# Patient Record
Sex: Female | Born: 1985 | Race: Black or African American | Hispanic: No | Marital: Single | Smoking: Current every day smoker
Health system: Southern US, Community
[De-identification: ages and names within clinical notes are randomized; demographics above are authoritative.]

## PROBLEM LIST (undated history)

## (undated) ENCOUNTER — Inpatient Hospital Stay (HOSPITAL_COMMUNITY): Payer: Self-pay

## (undated) DIAGNOSIS — O139 Gestational [pregnancy-induced] hypertension without significant proteinuria, unspecified trimester: Secondary | ICD-10-CM

## (undated) DIAGNOSIS — N390 Urinary tract infection, site not specified: Secondary | ICD-10-CM

## (undated) DIAGNOSIS — A599 Trichomoniasis, unspecified: Secondary | ICD-10-CM

## (undated) DIAGNOSIS — IMO0002 Reserved for concepts with insufficient information to code with codable children: Secondary | ICD-10-CM

## (undated) DIAGNOSIS — C539 Malignant neoplasm of cervix uteri, unspecified: Secondary | ICD-10-CM

## (undated) DIAGNOSIS — R51 Headache: Secondary | ICD-10-CM

## (undated) HISTORY — PX: LASER ABLATION CONDYLOMA CERVICAL / VULVAR: SUR819

---
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
## 2004-03-03 ENCOUNTER — Emergency Department: Payer: Self-pay | Admitting: Emergency Medicine

## 2005-07-30 ENCOUNTER — Emergency Department (HOSPITAL_COMMUNITY): Admission: EM | Admit: 2005-07-30 | Discharge: 2005-07-31 | Payer: Self-pay | Admitting: Emergency Medicine

## 2006-01-25 ENCOUNTER — Other Ambulatory Visit: Admission: RE | Admit: 2006-01-25 | Discharge: 2006-01-25 | Payer: Self-pay | Admitting: Obstetrics and Gynecology

## 2006-02-26 ENCOUNTER — Inpatient Hospital Stay (HOSPITAL_COMMUNITY): Admission: AD | Admit: 2006-02-26 | Discharge: 2006-02-26 | Payer: Self-pay | Admitting: Obstetrics and Gynecology

## 2006-05-13 ENCOUNTER — Inpatient Hospital Stay (HOSPITAL_COMMUNITY): Admission: AD | Admit: 2006-05-13 | Discharge: 2006-05-13 | Payer: Self-pay | Admitting: Obstetrics and Gynecology

## 2006-06-09 ENCOUNTER — Inpatient Hospital Stay (HOSPITAL_COMMUNITY): Admission: AD | Admit: 2006-06-09 | Discharge: 2006-06-11 | Payer: Self-pay | Admitting: Obstetrics and Gynecology

## 2006-06-09 ENCOUNTER — Encounter (INDEPENDENT_AMBULATORY_CARE_PROVIDER_SITE_OTHER): Payer: Self-pay | Admitting: *Deleted

## 2008-01-18 ENCOUNTER — Emergency Department (HOSPITAL_COMMUNITY): Admission: EM | Admit: 2008-01-18 | Discharge: 2008-01-19 | Payer: Self-pay | Admitting: Emergency Medicine

## 2009-05-26 ENCOUNTER — Emergency Department (HOSPITAL_COMMUNITY): Admission: EM | Admit: 2009-05-26 | Discharge: 2009-05-26 | Payer: Self-pay | Admitting: Family Medicine

## 2010-01-15 ENCOUNTER — Emergency Department (HOSPITAL_COMMUNITY): Admission: EM | Admit: 2010-01-15 | Discharge: 2010-01-15 | Payer: Self-pay | Admitting: Family Medicine

## 2010-07-14 LAB — POCT URINALYSIS DIPSTICK
Ketones, ur: NEGATIVE mg/dL
Protein, ur: NEGATIVE mg/dL
Specific Gravity, Urine: 1.015 (ref 1.005–1.030)
pH: 6 (ref 5.0–8.0)

## 2010-07-14 LAB — URINE CULTURE
Colony Count: NO GROWTH
Culture  Setup Time: 201109180144

## 2010-09-11 ENCOUNTER — Inpatient Hospital Stay (INDEPENDENT_AMBULATORY_CARE_PROVIDER_SITE_OTHER)
Admission: RE | Admit: 2010-09-11 | Discharge: 2010-09-11 | Disposition: A | Discharge status: Home / Self Care | Payer: Medicaid Other | Source: Ambulatory Visit | Attending: Family Medicine | Admitting: Family Medicine

## 2010-09-11 DIAGNOSIS — B373 Candidiasis of vulva and vagina: Secondary | ICD-10-CM

## 2010-09-11 LAB — POCT URINALYSIS DIP (DEVICE)
Glucose, UA: NEGATIVE mg/dL
Nitrite: NEGATIVE
Urobilinogen, UA: 0.2 mg/dL (ref 0.0–1.0)

## 2010-09-11 LAB — POCT PREGNANCY, URINE: Preg Test, Ur: NEGATIVE

## 2010-09-11 LAB — WET PREP, GENITAL: Yeast Wet Prep HPF POC: NONE SEEN

## 2010-09-16 NOTE — Discharge Summary (Signed)
Amy Cordova, Amy Cordova             ACCOUNT NO.:  [id number]   MEDICAL RECORD NO.:  [id number]          PATIENT TYPE:  INP   LOCATION:  9319                          FACILITY:  [facility]   PHYSICIAN:  Huel Cote, M.D. DATE OF BIRTH:  02/26/1986   DATE OF ADMISSION:  06/09/2006  DATE OF DISCHARGE:  06/11/2006                               DISCHARGE SUMMARY   DISCHARGE DIAGNOSES:  1. Preterm pregnancy at 34-2/7 weeks, delivered.  2. Premature rupture of membranes.  3. Status post normal spontaneous vaginal delivery.   DISCHARGE MEDICATIONS:  1. Motrin 600 mg p.o. every 6 hours.  2. Percocet 1-2 tablets p.o. every 4 hours p.r.n.   DISCHARGE FOLLOWUP:  The patient is to follow up in the office in  approximately 6 weeks for her routine postpartum exam.   HISTORY OF PRESENT ILLNESS:  The patient is a 25 year old G2, P1-0-0-1,  who was admitted at 34-2/7 weeks' gestation with spontaneous rupture of  membranes which awoke her from sleep.  Her prenatal care had been  uncomplicated up until this point.   Prenatal labs were as follows:  A+, antibody negative, sickle negative,  RPR nonreactive, rubella immune, hepatitis B surface antigen negative,  HIV negative, GC negative, chlamydia negative, group B strep unknown,  and 1-hour Glucola 89.   PAST OBSTETRICAL HISTORY:  In December 2004 the patient had a vaginal  delivery of a 6 pound 3 ounce infant.   PAST GYN HISTORY:  A LEEP in March 2006.   PAST SURGICAL HISTORY:  None.   PAST MEDICAL HISTORY:  None.   ALLERGIES:  None.   MEDICATIONS:  None.   HOSPITAL COURSE:  Afebrile with stable vital signs.  On admission fetal  heart rate was reactive.  Cervix was unfavorable, 30, fingertip and  station too high to be palpated but the baby was confirmed to be vertex  by ultrasound.  I discussed with the patient preterm rupture of  membranes at 34+ weeks and no labor and discussed with the patient that  current recommendations are that we  induce labor if greater than 34  weeks given the lower risk to the infant of delivery versus prolonged  possibility of infection.  We placed her on penicillin for unknown Group  B strep status and she was also placed on Stadol.  The patient had a  somewhat protracted labor course and after approximately 12 hours of  Pitocin did enter into a better labor pattern.  She eventually went from  4 cm dilation at 8 p.m. on February 9 to 10 cm in less than 1 hour.  She  pushed twice with a normal spontaneous vaginal delivery of a viable  female infant over an intact perineum, Apgars were 7 and 8, weight was 4  pounds 2 ounces.  Placenta delivered spontaneously and with three-vessel  cord and was sent to pathology.  Cord pH was 7.29.  Cervix and rectum  were intact.  The NICU team was present for delivery, and the baby had  spontaneous crying but did have some grunting and pulling and therefore  was taken to the NICU for  observation.  By postpartum day #1 the patient  was doing quite well.  Her cramping was controlled with Motrin.  Fundus  was firm.  The baby did have to be ultimately intubated but was stable  and came off within 24 hours.  By postpartum day #2 the patient was felt  stable for discharge home with follow-up as previously stated.  The baby  was going to be staying in the NICU and was stable at the time of the  patient's discharge.      Huel Cote, M.D.  Electronically Signed     KR/MEDQ  D:  07/10/2006  T:  07/12/2006  Job:  [id number]

## 2011-01-30 LAB — URINALYSIS, ROUTINE W REFLEX MICROSCOPIC
Glucose, UA: NEGATIVE
Hgb urine dipstick: NEGATIVE
Specific Gravity, Urine: 1.009

## 2011-01-30 LAB — RAPID URINE DRUG SCREEN, HOSP PERFORMED
Barbiturates: NOT DETECTED
Benzodiazepines: NOT DETECTED
Cocaine: NOT DETECTED

## 2011-01-30 LAB — PREGNANCY, URINE: Preg Test, Ur: NEGATIVE

## 2011-03-13 ENCOUNTER — Encounter: Payer: Self-pay | Admitting: *Deleted

## 2011-03-13 ENCOUNTER — Emergency Department (INDEPENDENT_AMBULATORY_CARE_PROVIDER_SITE_OTHER)
Admission: EM | Admit: 2011-03-13 | Discharge: 2011-03-13 | Disposition: A | Discharge status: Home / Self Care | Payer: Self-pay | Source: Home / Self Care | Attending: Family Medicine | Admitting: Family Medicine

## 2011-03-13 DIAGNOSIS — N760 Acute vaginitis: Secondary | ICD-10-CM

## 2011-03-13 DIAGNOSIS — J019 Acute sinusitis, unspecified: Secondary | ICD-10-CM

## 2011-03-13 LAB — WET PREP, GENITAL: Clue Cells Wet Prep HPF POC: NONE SEEN

## 2011-03-13 MED ORDER — AMOXICILLIN 875 MG PO TABS: 875.0000 mg | ORAL_TABLET | Freq: Two times a day (BID) | ORAL | Status: AC

## 2011-03-13 MED ORDER — FLUCONAZOLE 150 MG PO TABS: 150.0000 mg | ORAL_TABLET | Freq: Once | ORAL | Status: AC

## 2011-03-13 NOTE — ED Provider Notes (Signed)
History     CSN: [id number] Arrival date & time: 03/13/2011  9:59 AM   First MD Initiated Contact with Patient 03/13/11 1029      Chief Complaint  Patient presents with  . URI  . Vaginal Discharge    (Consider location/radiation/quality/duration/timing/severity/associated sxs/prior treatment) Patient is a 25 y.o. female presenting with URI and vaginal discharge. The history is provided by the patient.  URI The primary symptoms include sore throat and cough. Primary symptoms do not include fever, fatigue, headaches, ear pain, swollen glands, wheezing, abdominal pain, nausea or vomiting. The current episode started 3 to 5 days ago. This is a new problem. The problem has not changed since onset. The sore throat began more than 2 days ago. The sore throat has been unchanged since its onset. The sore throat is mild in intensity. The sore throat is accompanied by hoarse voice. The sore throat is not accompanied by trouble swallowing, drooling or stridor.  The cough began 3 to 5 days ago. The cough is new. The cough is productive. The sputum is green (cough and sputum due to post nasal drainage).  Symptoms associated with the illness include congestion. The illness is not associated with chills, plugged ear sensation, facial pain, sinus pressure or rhinorrhea. The following treatments were addressed: A decongestant was ineffective.  Vaginal Discharge This is a new problem. The current episode started 2 days ago (began after use of new soap, c/o external irritation and small amount of clear vaginal discharge). The problem occurs constantly. The problem has not changed since onset.Pertinent negatives include no chest pain, no abdominal pain, no headaches and no shortness of breath. The symptoms are aggravated by nothing. The symptoms are relieved by nothing. She has tried nothing for the symptoms.    Past Medical History  Diagnosis Date  . Hypertension     History reviewed. No pertinent past  surgical history.  Family History  Problem Relation Age of Onset  . Hypertension Mother   . Diabetes Mother   . Diabetes Father   . Hypertension Father     History  Substance Use Topics  . Smoking status: Current Everyday Smoker  . Smokeless tobacco: Not on file  . Alcohol Use: Yes     socially    OB History    Grav Para Term Preterm Abortions TAB SAB Ect Mult Living                  Review of Systems  Constitutional: Negative for fever, chills and fatigue.  HENT: Positive for congestion, sore throat, hoarse voice and postnasal drip. Negative for ear pain, rhinorrhea, sneezing, drooling, trouble swallowing and sinus pressure.   Respiratory: Positive for cough. Negative for shortness of breath, wheezing and stridor.   Cardiovascular: Negative for chest pain and palpitations.  Gastrointestinal: Negative for nausea, vomiting and abdominal pain.  Genitourinary: Positive for vaginal discharge. Negative for dysuria, urgency, frequency, vaginal bleeding, vaginal pain and pelvic pain.  Neurological: Negative for headaches.    Allergies  Review of patient's allergies indicates no known allergies.  Home Medications   Current Outpatient Rx  Name Route Sig Dispense Refill  . DAYTIME COLD MEDICINE PO Oral Take by mouth 1 day or 1 dose.      Marland Kitchen NIGHTTIME COLD MEDICINE PO Oral Take by mouth at bedtime as needed.      . AMOXICILLIN 875 MG PO TABS Oral Take 1 tablet (875 mg total) by mouth 2 (two) times daily. 20 tablet 0  .  FLUCONAZOLE 150 MG PO TABS Oral Take 1 tablet (150 mg total) by mouth once. 1 tablet 0    BP 126/84  Pulse 80  Temp(Src) 98.6 F (37 C) (Oral)  Resp 16  SpO2 100%  LMP 02/24/2011  Physical Exam  Nursing note and vitals reviewed. Constitutional: She appears well-developed and well-nourished. No distress.  HENT:  Head: Normocephalic and atraumatic.  Right Ear: Tympanic membrane, external ear and ear canal normal.  Left Ear: Tympanic membrane, external ear  and ear canal normal.  Nose: Nose normal.  Mouth/Throat: Uvula is midline, oropharynx is clear and moist and mucous membranes are normal. No oropharyngeal exudate, posterior oropharyngeal edema or posterior oropharyngeal erythema.  Neck: Neck supple.  Cardiovascular: Normal rate, regular rhythm and normal heart sounds.   Pulmonary/Chest: Effort normal and breath sounds normal. No respiratory distress.  Abdominal: Soft. She exhibits no distension and no mass. There is no tenderness. There is no guarding.  Genitourinary: Vagina normal and uterus normal. There is no rash, tenderness, lesion or injury on the right labia. There is no rash, tenderness, lesion or injury on the left labia. Cervix exhibits no motion tenderness, no discharge and no friability. Right adnexum displays no mass, no tenderness and no fullness. Left adnexum displays no mass, no tenderness and no fullness.  Lymphadenopathy:    She has no cervical adenopathy.  Neurological: She is alert.  Skin: Skin is warm and dry.  Psychiatric: She has a normal mood and affect.    ED Course  Procedures (including critical care time)   Labs Reviewed  GC/CHLAMYDIA PROBE AMP, GENITAL  WET PREP, GENITAL   No results found.   1. Sinusitis, acute   2. Vulvovaginitis       MDM  GC/Chlamydia & Wet prep pending.         Melody Comas, [place] 03/13/11 1123

## 2011-03-13 NOTE — ED Provider Notes (Signed)
Medical screening examination/treatment/procedure(s) were performed by non-physician practitioner and as supervising physician I was immediately available for consultation/collaboration.  LYKINS,KIMBERLY G  D.O.    Randa Spike, MD 03/13/11 [phone]

## 2011-03-13 NOTE — ED Notes (Signed)
Pt has 2 complaints.  C/o sorethroat, cough and feels drainage going down back of throat from her ears. Onset 4 days ago  Denies fever, bodyaches or chills.  Throat unremarkable.  Pt also c/o clear vaginal discharge with a slight odor onset 2 days ago.  Denies any low abd pain or urinary sx.  Thinks it's due to change of soap that she is using.

## 2011-03-14 LAB — GC/CHLAMYDIA PROBE AMP, GENITAL: Chlamydia, DNA Probe: NEGATIVE

## 2011-07-25 ENCOUNTER — Encounter (HOSPITAL_COMMUNITY): Payer: Self-pay | Admitting: *Deleted

## 2011-07-25 ENCOUNTER — Emergency Department (HOSPITAL_COMMUNITY)
Admission: EM | Admit: 2011-07-25 | Discharge: 2011-07-25 | Disposition: A | Discharge status: Home / Self Care | Payer: Medicaid Other | Attending: Emergency Medicine | Admitting: Emergency Medicine

## 2011-07-25 DIAGNOSIS — I10 Essential (primary) hypertension: Secondary | ICD-10-CM | POA: Insufficient documentation

## 2011-07-25 DIAGNOSIS — R51 Headache: Secondary | ICD-10-CM

## 2011-07-25 DIAGNOSIS — F17200 Nicotine dependence, unspecified, uncomplicated: Secondary | ICD-10-CM | POA: Insufficient documentation

## 2011-07-25 HISTORY — DX: Malignant neoplasm of cervix uteri, unspecified: C53.9

## 2011-07-25 MED ORDER — METOCLOPRAMIDE HCL 5 MG/ML IJ SOLN
10.0000 mg | Freq: Once | INTRAMUSCULAR | Status: AC
Start: 2011-07-25 — End: 2011-07-25
  Administered 2011-07-25: 10 mg via INTRAVENOUS
  Filled 2011-07-25: qty 2

## 2011-07-25 MED ORDER — DIPHENHYDRAMINE HCL 50 MG/ML IJ SOLN
25.0000 mg | Freq: Once | INTRAMUSCULAR | Status: AC
  Administered 2011-07-25: 25 mg via INTRAVENOUS
  Filled 2011-07-25: qty 1

## 2011-07-25 MED ORDER — SODIUM CHLORIDE 0.9 % IV BOLUS (SEPSIS)
1000.0000 mL | Freq: Once | INTRAVENOUS | Status: AC
  Administered 2011-07-25: 1000 mL via INTRAVENOUS

## 2011-07-25 MED ORDER — IBUPROFEN 800 MG PO TABS: 800.0000 mg | ORAL_TABLET | Freq: Three times a day (TID) | ORAL | Status: AC

## 2011-07-25 MED ORDER — DEXAMETHASONE SODIUM PHOSPHATE 4 MG/ML IJ SOLN
10.0000 mg | Freq: Once | INTRAMUSCULAR | Status: AC
  Administered 2011-07-25: 10 mg via INTRAVENOUS
  Filled 2011-07-25: qty 1

## 2011-07-25 NOTE — ED Notes (Signed)
Pt c/o of migraine on the right side of her head radiating down her neck. Pt reports blurry vision, and photophobia. Pt reports onset three days ago. Pt also reports toothache on right lower molar. Pt reports toothache for unknown amount of time.

## 2011-07-25 NOTE — ED Notes (Signed)
Per EMS: pt is coming from home with c/o headache for three days associated with toothache. Pt has n/v. Pt reports vomiting around midnight tonight. Pt states she has taken ibuprofen, unknown amount, without relief. Pt reports taking oral-gel without relief. Pt has hx of migraines.

## 2011-07-25 NOTE — ED Notes (Signed)
[id number]<AV> Expected date:07/25/11<BR> Expected time: 3:12 AM<BR> Means of arrival:Ambulance<BR> Comments:<BR> n,v

## 2011-07-25 NOTE — Discharge Instructions (Signed)
Headache  Headaches are caused by many different problems. Most commonly, headache is caused by muscle tension from an injury, fatigue, or emotional upset. Excessive muscle contractions in the scalp and neck result in a headache that often feels like a tight band around the head. Tension headaches often have areas of tenderness over the scalp and the back of the neck. These headaches may last for hours, days, or longer, and some may contribute to migraines in those who have migraine problems.  Migraines usually cause a throbbing headache, which is made worse by activity. Sometimes only one side of the head hurts. Nausea, vomiting, eye pain, and avoidance of food are common with migraines. Visual symptoms such as light sensitivity, blind spots, or flashing lights may also occur. Loud noises may worsen migraine headaches. Many factors may cause migraine headaches:  Emotional stress, lack of sleep, and menstrual periods.  Alcohol and some drugs (such as birth control pills).  Diet factors (fasting, caffeine, food preservatives, chocolate).  Environmental factors (weather changes, bright lights, odors, smoke).  Other causes of headaches include minor injuries to the head. Arthritis in the neck; problems with the jaw, eyes, ears, or nose are also causes of headaches. Allergies, drugs, alcohol, and exposure to smoke can also cause moderate headaches. Rebound headaches can occur if someone uses pain medications for a long period of time and then stops. Less commonly, blood vessel problems in the neck and brain (including stroke) can cause various types of headache.  Treatment of headaches includes medicines for pain and relaxation. Ice packs or heat applied to the back of the head and neck help some people. Massaging the shoulders, neck and scalp are often very useful. Relaxation techniques and stretching can help prevent these headaches. Avoid alcohol and cigarette smoking as these tend to make headaches worse.  Please see your caregiver if your headache is not better in 2 days.  SEEK IMMEDIATE MEDICAL CARE IF:  You develop a high fever, chills, or repeated vomiting.  You faint or have difficulty with vision.  You develop unusual numbness or weakness of your arms or legs.  Relief of pain is inadequate with medication, or you develop severe pain.  You develop confusion, or neck stiffness.  You have a worsening of a headache or do not obtain relief.

## 2011-07-25 NOTE — ED Provider Notes (Signed)
History     CSN: [id number]  Arrival date & time 07/25/11  0345   First MD Initiated Contact with Patient 07/25/11 0541      Chief Complaint  Patient presents with  . Migraine    (Consider location/radiation/quality/duration/timing/severity/associated sxs/prior treatment) Patient is a 26 y.o. female presenting with migraine. The history is provided by the patient.  Migraine This is a new problem. The current episode started more than 2 days ago. The problem occurs constantly. The problem has been gradually worsening. Associated symptoms include headaches. Pertinent negatives include no chest pain, no abdominal pain and no shortness of breath. Exacerbated by: Light and loud noises. The symptoms are relieved by nothing. Treatments tried: Ibuprofen. The treatment provided no relief.   Right-sided headache. Throbbing in quality. No radiation. Has associated nausea and vomiting today. No fevers. No neck stiffness. No weakness or numbness. No Difficulty with vision or speech. Patient has history of migraine headaches and this headache feels similar to those in the past. No sudden onset or thunderclap headache. This is not worsening of life. Past Medical History  Diagnosis Date  . Hypertension   . Cervical cancer     History reviewed. No pertinent past surgical history.  Family History  Problem Relation Age of Onset  . Hypertension Mother   . Diabetes Mother   . Diabetes Father   . Hypertension Father     History  Substance Use Topics  . Smoking status: Current Everyday Smoker  . Smokeless tobacco: Not on file  . Alcohol Use: Yes     socially    OB History    Grav Para Term Preterm Abortions TAB SAB Ect Mult Living                  Review of Systems  Constitutional: Negative for fever and chills.  HENT: Negative for neck pain and neck stiffness.   Eyes: Negative for pain.  Respiratory: Negative for shortness of breath.   Cardiovascular: Negative for chest pain.    Gastrointestinal: Negative for abdominal pain.  Genitourinary: Negative for dysuria.  Musculoskeletal: Negative for back pain.  Skin: Negative for rash.  Neurological: Positive for headaches.  All other systems reviewed and are negative.    Allergies  Review of patient's allergies indicates no known allergies.  Home Medications   Current Outpatient Rx  Name Route Sig Dispense Refill  . IBUPROFEN 200 MG PO TABS Oral Take 800 mg by mouth every 6 (six) hours as needed. Pain      BP 123/67  Pulse 65  Temp(Src) 97.6 F (36.4 C) (Oral)  Resp 16  SpO2 100%  Physical Exam  Constitutional: She is oriented to person, place, and time. She appears well-developed and well-nourished.  HENT:  Head: Normocephalic and atraumatic.  Eyes: Conjunctivae and EOM are normal. Pupils are equal, round, and reactive to light.  Neck: Full passive range of motion without pain. Neck supple. No thyromegaly present.       No meningismus  Cardiovascular: Normal rate, regular rhythm, S1 normal, S2 normal and intact distal pulses.   Pulmonary/Chest: Effort normal and breath sounds normal.  Abdominal: Soft. Bowel sounds are normal. There is no tenderness. There is no CVA tenderness.  Musculoskeletal: Normal range of motion.  Neurological: She is alert and oriented to person, place, and time. She has normal strength and normal reflexes. No cranial nerve deficit or sensory deficit. She displays a negative Romberg sign. GCS eye subscore is 4. GCS verbal subscore is 5.  GCS motor subscore is 6.       Normal Gait  Skin: Skin is warm and dry. No rash noted. No cyanosis. Nails show no clubbing.  Psychiatric: She has a normal mood and affect. Her speech is normal and behavior is normal.    ED Course  Procedures (including critical care time)  IV fluids. Headache cocktail provided.   MDM   Presentation consistent with migraine headache. History of same. Medications provided as above and at 7:55 AM is feeling  much better and requesting to be discharged home. No change in normal neuro exam. Neurology referral provided.         Sunnie Nielsen, MD 07/25/11 [phone]

## 2011-07-25 NOTE — ED Notes (Signed)
Pt ambulated to restroom assisted by friend

## 2011-09-05 ENCOUNTER — Emergency Department (INDEPENDENT_AMBULATORY_CARE_PROVIDER_SITE_OTHER)
Admission: EM | Admit: 2011-09-05 | Discharge: 2011-09-05 | Disposition: A | Discharge status: Home / Self Care | Payer: Self-pay | Source: Home / Self Care | Attending: Emergency Medicine | Admitting: Emergency Medicine

## 2011-09-05 ENCOUNTER — Encounter (HOSPITAL_COMMUNITY): Payer: Self-pay | Admitting: *Deleted

## 2011-09-05 DIAGNOSIS — N72 Inflammatory disease of cervix uteri: Secondary | ICD-10-CM

## 2011-09-05 LAB — WET PREP, GENITAL: Yeast Wet Prep HPF POC: NONE SEEN

## 2011-09-05 LAB — POCT URINALYSIS DIP (DEVICE)
Bilirubin Urine: NEGATIVE
Ketones, ur: 15 mg/dL — AB
Nitrite: NEGATIVE
Specific Gravity, Urine: 1.02 (ref 1.005–1.030)
pH: 7 (ref 5.0–8.0)

## 2011-09-05 MED ORDER — METRONIDAZOLE 500 MG PO TABS
ORAL_TABLET | ORAL | Status: AC
  Filled 2011-09-05: qty 4

## 2011-09-05 MED ORDER — CEFTRIAXONE SODIUM 250 MG IJ SOLR
INTRAMUSCULAR | Status: AC
  Filled 2011-09-05: qty 250

## 2011-09-05 MED ORDER — AZITHROMYCIN 250 MG PO TABS
1000.0000 mg | ORAL_TABLET | Freq: Every day | ORAL | Status: DC
  Administered 2011-09-05: 1000 mg via ORAL

## 2011-09-05 MED ORDER — LIDOCAINE HCL (PF) 1 % IJ SOLN
INTRAMUSCULAR | Status: AC
  Filled 2011-09-05: qty 5

## 2011-09-05 MED ORDER — CEFTRIAXONE SODIUM 250 MG IJ SOLR
250.0000 mg | Freq: Once | INTRAMUSCULAR | Status: AC
  Administered 2011-09-05: 250 mg via INTRAMUSCULAR

## 2011-09-05 MED ORDER — FLUCONAZOLE 150 MG PO TABS: 150.0000 mg | ORAL_TABLET | Freq: Once | ORAL | Status: AC

## 2011-09-05 MED ORDER — METRONIDAZOLE 500 MG PO TABS
2000.0000 mg | ORAL_TABLET | ORAL | Status: AC
  Administered 2011-09-05: 2000 mg via ORAL

## 2011-09-05 MED ORDER — AZITHROMYCIN 250 MG PO TABS
ORAL_TABLET | ORAL | Status: AC
  Filled 2011-09-05: qty 4

## 2011-09-05 NOTE — ED Notes (Signed)
1 week of  Vaginal odor and low back pain.  Pt denies vaginal discharge/frequency/dysuria.

## 2011-09-05 NOTE — ED Provider Notes (Signed)
History     CSN: [id number]  Arrival date & time 09/05/11  0945   First MD Initiated Contact with Patient 09/05/11 1102      Chief Complaint  Patient presents with  . Exposure to STD    (Consider location/radiation/quality/duration/timing/severity/associated sxs/prior treatment) HPI Comments: Patient reports 1 week of vaginal odor, lower abdominal cramping and low back pain.  States she had unprotected sex with her ex-boyfriend just before the symptoms began.  States while she has odor she has not noticed abnormal discharge or bleeding.  Pt had normal menstrual period last week.  Has Mirena in place.  Denies fevers, N/V, urinary symptoms.  States she has previously been infected with trichomonas from this person.    Patient is a 26 y.o. female presenting with STD exposure. The history is provided by the patient.  Exposure to STD    Past Medical History  Diagnosis Date  . Hypertension   . Cervical cancer     History reviewed. No pertinent past surgical history.  Family History  Problem Relation Age of Onset  . Hypertension Mother   . Diabetes Mother   . Diabetes Father   . Hypertension Father     History  Substance Use Topics  . Smoking status: Current Everyday Smoker  . Smokeless tobacco: Not on file  . Alcohol Use: Yes     socially    OB History    Grav Para Term Preterm Abortions TAB SAB Ect Mult Living                  Review of Systems  Constitutional: Negative for fever and chills.  Gastrointestinal: Negative for nausea and vomiting.  Genitourinary: Positive for pelvic pain. Negative for dysuria, urgency, frequency, vaginal bleeding, vaginal discharge and menstrual problem.    Allergies  Review of patient's allergies indicates no known allergies.  Home Medications   Current Outpatient Rx  Name Route Sig Dispense Refill  . IBUPROFEN 200 MG PO TABS Oral Take 800 mg by mouth every 6 (six) hours as needed. Pain    . FLUCONAZOLE 150 MG PO TABS Oral  Take 1 tablet (150 mg total) by mouth once. 1 tablet 0    BP 132/86  Pulse 66  Temp(Src) 97.7 F (36.5 C) (Oral)  Resp 18  SpO2 96%  LMP 08/27/2011  Physical Exam  Nursing note and vitals reviewed. Constitutional: She is oriented to person, place, and time. She appears well-developed and well-nourished.  HENT:  Head: Normocephalic and atraumatic.  Neck: Neck supple.  Pulmonary/Chest: Effort normal.  Genitourinary: Uterus is not enlarged and not tender. Cervix exhibits no discharge and no friability. Right adnexum displays no mass, no tenderness and no fullness. Left adnexum displays no mass, no tenderness and no fullness. No bleeding around the vagina. Vaginal discharge found.       Cervix is mildly tender to palpation.  Mirena strings visualized through os.  Os is closed.  Thin watery discharge within vagina.    Neurological: She is alert and oriented to person, place, and time.    ED Course  Procedures (including critical care time)  Labs Reviewed  POCT URINALYSIS DIP (DEVICE) - Abnormal; Notable for the following:    Ketones, ur 15 (*)    Hgb urine dipstick TRACE (*)    All other components within normal limits  POCT PREGNANCY, URINE  GC/CHLAMYDIA PROBE AMP, GENITAL  WET PREP, GENITAL   No results found.   1. Cervicitis  MDM  Afebrile, nontoxic patient c/o vaginal odor after unprotected sex.  Pt's cervix is tender and she has discharge that clinically appears to be trichomonas.  While patient may be infected with STD, I doubt PID or any deeper infection.  As patient has contracted this from the same person before, after our discussion she opted to not wait for results but to go ahead with treatment.  Pt also requested fluconazole in the event she developed a yeast infection following the antibiotics.  Pt advised to follow up with her PCP or the health department for HIV testing.  Precautions given for worsening symptoms, reasons to go directly to [facility] hospital  for further evaluation and treatment.          Rise Patience, [place] 09/05/11 1250

## 2011-09-05 NOTE — Discharge Instructions (Signed)
Please read the information below.  You were treated today for gonorrhea, chlamydia, and trichomonas.  You can call medical records to find out the results of your tests.  You sexual partner(s) should be tested and treated.  Do not have any sexual activity until everyone is treated.  If you develop worsening abdominal pain, increased vaginal discharge, fevers, or vomiting, please go to [facility] (MAU) immediately.  You may return to the urgent care at any time for worsening condition or any new symptoms that concern you        Cervicitis Cervicitis is a soreness and swelling (inflammation) of the cervix. Your cervix is located at the bottom of your uterus which opens up to the vagina.  CAUSES   Sexually transmitted infections (STIs).   Allergic reaction.   Medicines or birth control devices that are put in the vagina.   Injury to the cervix.   Bacterial infections.  SYMPTOMS  There may be no symptoms. If symptoms occur, they may include:  Grey, white, yellow, or bad smelling vaginal discharge.   Pain or itching of the area outside the vagina.   Painful sexual intercourse.   Lower abdominal or lower back pain, especially during intercourse.   Frequent urination.   Abnormal vaginal bleeding between periods, after sexual intercourse, or after menopause.   Pressure or a heavy feeling in the pelvis.  DIAGNOSIS  Diagnosis is made after a pelvic exam. Other tests may include:  Examination of any discharge under a microscope (wet prep).   A Pap test.  TREATMENT  Treatment will depend on the cause of cervicitis. If it is caused by an STI, both you and your partner will need to be treated. Antibiotic medicines will be given. HOME CARE INSTRUCTIONS   Do not have sexual intercourse until your caregiver says it is okay.   Do not have sexual intercourse until your partner has been treated if your cervicitis is caused by an STI.   Take your antibiotics as directed. Finish  them even if you start to feel better.  SEEK IMMEDIATE MEDICAL CARE IF:   Your symptoms come back.   You have a fever.   You experience any problems that may be related to the medicine you are taking.  MAKE SURE YOU:   Understand these instructions.   Will watch your condition.   Will get help right away if you are not doing well or get worse.  Document Released: 04/17/2005 Document Revised: 04/06/2011 Document Reviewed: 11/14/2010 [facility] Patient Information 2012 [place], [place].

## 2011-09-05 NOTE — ED Provider Notes (Signed)
Medical screening examination/treatment/procedure(s) were performed by non-physician practitioner and as supervising physician I was immediately available for consultation/collaboration.  Leslee Home, M.D.   Reuben Likes, MD 09/05/11 [phone]

## 2011-12-07 ENCOUNTER — Encounter (HOSPITAL_COMMUNITY): Payer: Self-pay | Admitting: *Deleted

## 2011-12-07 ENCOUNTER — Inpatient Hospital Stay (HOSPITAL_COMMUNITY)
Admission: AD | Admit: 2011-12-07 | Discharge: 2011-12-07 | Disposition: A | Discharge status: Home / Self Care | Payer: Self-pay | Source: Ambulatory Visit | Attending: Obstetrics and Gynecology | Admitting: Obstetrics and Gynecology

## 2011-12-07 ENCOUNTER — Inpatient Hospital Stay (HOSPITAL_COMMUNITY): Payer: Self-pay

## 2011-12-07 DIAGNOSIS — T839XXA Unspecified complication of genitourinary prosthetic device, implant and graft, initial encounter: Secondary | ICD-10-CM

## 2011-12-07 DIAGNOSIS — R109 Unspecified abdominal pain: Secondary | ICD-10-CM | POA: Insufficient documentation

## 2011-12-07 DIAGNOSIS — D279 Benign neoplasm of unspecified ovary: Secondary | ICD-10-CM

## 2011-12-07 DIAGNOSIS — N949 Unspecified condition associated with female genital organs and menstrual cycle: Secondary | ICD-10-CM | POA: Insufficient documentation

## 2011-12-07 DIAGNOSIS — N926 Irregular menstruation, unspecified: Secondary | ICD-10-CM

## 2011-12-07 DIAGNOSIS — N938 Other specified abnormal uterine and vaginal bleeding: Secondary | ICD-10-CM | POA: Insufficient documentation

## 2011-12-07 DIAGNOSIS — Z30431 Encounter for routine checking of intrauterine contraceptive device: Secondary | ICD-10-CM | POA: Insufficient documentation

## 2011-12-07 HISTORY — DX: Headache: R51

## 2011-12-07 HISTORY — DX: Gestational (pregnancy-induced) hypertension without significant proteinuria, unspecified trimester: O13.9

## 2011-12-07 HISTORY — DX: Trichomoniasis, unspecified: A59.9

## 2011-12-07 HISTORY — DX: Reserved for concepts with insufficient information to code with codable children: IMO0002

## 2011-12-07 HISTORY — DX: Urinary tract infection, site not specified: N39.0

## 2011-12-07 LAB — URINALYSIS, ROUTINE W REFLEX MICROSCOPIC
Glucose, UA: NEGATIVE mg/dL
Hgb urine dipstick: NEGATIVE
Leukocytes, UA: NEGATIVE
Specific Gravity, Urine: >1.03 — ABNORMAL HIGH (ref 1.005–1.030)
pH: 6 (ref 5.0–8.0)

## 2011-12-07 LAB — POCT PREGNANCY, URINE: Preg Test, Ur: NEGATIVE

## 2011-12-07 MED ORDER — IBUPROFEN 600 MG PO TABS: 600.0000 mg | ORAL_TABLET | Freq: Four times a day (QID) | ORAL | Status: DC | PRN

## 2011-12-07 MED ORDER — IBUPROFEN 600 MG PO TABS
600.0000 mg | ORAL_TABLET | Freq: Once | ORAL | Status: AC
  Administered 2011-12-07: 600 mg via ORAL
  Filled 2011-12-07: qty 1

## 2011-12-07 NOTE — MAU Note (Signed)
Had mirena removed and a new one placed in June.  preg test at that time was neg.  Was 3 months late in getting it changed.  Was having cramping- was told might be too early to truly dx if preg or not.  Has appt on Mon for [place], they couldn't see strings when was there a wk ago. Started bleeding this morning, cramping was worse.

## 2011-12-07 NOTE — MAU Provider Note (Signed)
Amy Moore26 [id number]  Chief Complaint  Patient presents with  . Abdominal Pain  . Vaginal Bleeding     None     SUBJECTIVE  HPI: Began having lower abdominal cramping and light vaginal bleeding today. Had been  amenorrheic on her IUD Mirena IUD which was removed after 5+ years and another one placed in June 2013. States she was told she could have very early pregnancy though test was negative. She went to the office about a week and a half ago for string check and strings were not visualized. Not aware of expelling device and never felt strings herself. She has an appointment in a few days for an office ultrasound to check IUD placement.   Past Medical History  Diagnosis Date  . Hypertension   . Cervical cancer   . Abnormal Pap smear 2000    cervical - laser treatment  . Pregnancy induced hypertension   . Headache   . Urinary tract infection   . Trichomonas    Past Surgical History  Procedure Date  . No past surgeries    History   Social History  . Marital Status: Single    Spouse Name: N/A    Number of Children: N/A  . Years of Education: N/A   Occupational History  . Not on file.   Social History Main Topics  . Smoking status: Current Some Day Smoker -- 2 years    Types: Cigarettes  . Smokeless tobacco: Not on file  . Alcohol Use: Yes     socially  . Drug Use: No  . Sexually Active: Yes    Birth Control/ Protection: IUD   Other Topics Concern  . Not on file   Social History Narrative  . No narrative on file   No current facility-administered medications on file prior to encounter.   Current Outpatient Prescriptions on File Prior to Encounter  Medication Sig Dispense Refill  . ibuprofen (ADVIL,MOTRIN) 200 MG tablet Take 200 mg by mouth every 6 (six) hours as needed. Pain       No Known Allergies  ROS: Pertinent items in HPI  OBJECTIVE Blood pressure 134/79, pulse 71, temperature 97.8 F (36.6 C), temperature source Oral, resp. rate 20,  height 5\' 6"  (1.676 m), weight 97.977 kg (216 lb).  GENERAL: Well-developed, well-nourished female in no acute distress.  HEENT: Normocephalic, good dentition HEART: normal rate RESP: normal effort ABDOMEN: Soft, nontender EXTREMITIES: Nontender, no edema NEURO: Alert and oriented SPECULUM EXAM: NEFG, physiologic discharge, no blood noted, cervix clean, strings not seen BIMANUAL: cervix multiparous; uterus NSSP; no adnexal tenderness or masses   LAB RESULTS  Results for orders placed during the hospital encounter of 12/07/11 (from the past 24 hour(s))  URINALYSIS, ROUTINE W REFLEX MICROSCOPIC     Status: Abnormal   Collection Time   12/07/11  9:00 AM      Component Value Range   Color, Urine YELLOW  YELLOW   APPearance CLEAR  CLEAR   Specific Gravity, Urine >1.030 (*) 1.005 - 1.030   pH 6.0  5.0 - 8.0   Glucose, UA NEGATIVE  NEGATIVE mg/dL   Hgb urine dipstick NEGATIVE  NEGATIVE   Bilirubin Urine NEGATIVE  NEGATIVE   Ketones, ur 15 (*) NEGATIVE mg/dL   Protein, ur NEGATIVE  NEGATIVE mg/dL   Urobilinogen, UA 0.2  0.0 - 1.0 mg/dL   Nitrite NEGATIVE  NEGATIVE   Leukocytes, UA NEGATIVE  NEGATIVE  POCT PREGNANCY, URINE     Status: Normal  Collection Time   12/07/11  9:46 AM      Component Value Range   Preg Test, Ur NEGATIVE  NEGATIVE    IMAGING *RADIOLOGY REPORT*  Clinical Data: IUD string not visualized. Vaginal bleeding and  cramps  TRANSABDOMINAL AND TRANSVAGINAL ULTRASOUND OF PELVIS  Technique: Both transabdominal and transvaginal ultrasound  examinations of the pelvis were performed. Transabdominal technique  was performed for global imaging of the pelvis including uterus,  ovaries, adnexal regions, and pelvic cul-de-sac.  It was necessary to proceed with endovaginal exam following the  transabdominal exam to visualize the myometrium, endometrium and  adnexa.  Comparison: None  Findings:  Uterus: Is anteverted and anteflexed and demonstrates a sagittal  length of  9.9 cm, depth of 4.5 cm and width of 6.5 cm. A  homogeneous uterine myometrium is seen  Endometrium: Is thin and echogenic. The patient's IUD appears  appropriately situated within the endometrial canal on 3-D coronal  reformatted images.  Right ovary: Measures 3.7 x 2.6 x 2.7 cm and contains a focal  echogenic area measuring 1.2 x 1.1 x 1.4 cm and suspicious for a  small intraovarian dermoid. An acute hemorrhagic focus can have a  similar appearance  Left ovary: Has a normal appearance measuring 4.0 x 1.9 x 2.2 cm  Other findings: No pelvic fluid or separate adnexal masses are  noted.  IMPRESSION:  Appropriate IUD positioning confirmed within the endometrial canal  with 3-D coronal imaging.  Normal myometrium and thin endometrium.  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 Short-term reassessment is  recommended in 6 months. A dermoid would be expected to  demonstrate no interval change over this period of time.  Original Report Authenticated By: Bertha Stakes, M.D.   ASSESSMENT  IUD strings not visualized but device in appropriate place, normal to have light menses, and not pregnant Prob small rt ovarian dermoid   PLAN Reassured re: IUD placement though retracted strings and UPT is negative Medication List  As of 12/07/2011  9:23 AM   ASK your doctor about these medications         ibuprofen 200 MG tablet   Commonly known as: ADVIL,MOTRIN   Take 200 mg by mouth every 6 (six) hours as needed. Pain           F/U prn Dr. Ambrose Mantle and call for appointment for 6 months to F/U probable dermoid Called Dr. Ebony Hail office and will review case with him later as pt anxious for discharge.      [name] 12/07/2011 9:23 AM

## 2012-05-10 ENCOUNTER — Inpatient Hospital Stay (HOSPITAL_COMMUNITY)
Admission: AD | Admit: 2012-05-10 | Discharge: 2012-05-10 | Disposition: A | Discharge status: Home / Self Care | Payer: Self-pay | Source: Ambulatory Visit | Attending: Obstetrics & Gynecology | Admitting: Obstetrics & Gynecology

## 2012-05-10 ENCOUNTER — Encounter (HOSPITAL_COMMUNITY): Payer: Self-pay | Admitting: *Deleted

## 2012-05-10 DIAGNOSIS — B9689 Other specified bacterial agents as the cause of diseases classified elsewhere: Secondary | ICD-10-CM | POA: Insufficient documentation

## 2012-05-10 DIAGNOSIS — N949 Unspecified condition associated with female genital organs and menstrual cycle: Secondary | ICD-10-CM | POA: Insufficient documentation

## 2012-05-10 DIAGNOSIS — N760 Acute vaginitis: Secondary | ICD-10-CM | POA: Insufficient documentation

## 2012-05-10 DIAGNOSIS — R109 Unspecified abdominal pain: Secondary | ICD-10-CM | POA: Insufficient documentation

## 2012-05-10 DIAGNOSIS — N83201 Unspecified ovarian cyst, right side: Secondary | ICD-10-CM

## 2012-05-10 DIAGNOSIS — R1084 Generalized abdominal pain: Secondary | ICD-10-CM

## 2012-05-10 DIAGNOSIS — Z30432 Encounter for removal of intrauterine contraceptive device: Secondary | ICD-10-CM | POA: Insufficient documentation

## 2012-05-10 DIAGNOSIS — A499 Bacterial infection, unspecified: Secondary | ICD-10-CM | POA: Insufficient documentation

## 2012-05-10 DIAGNOSIS — N83209 Unspecified ovarian cyst, unspecified side: Secondary | ICD-10-CM | POA: Insufficient documentation

## 2012-05-10 LAB — WET PREP, GENITAL: Trich, Wet Prep: NONE SEEN

## 2012-05-10 LAB — POCT PREGNANCY, URINE: Preg Test, Ur: NEGATIVE

## 2012-05-10 MED ORDER — METRONIDAZOLE 500 MG PO TABS: 500.0000 mg | ORAL_TABLET | Freq: Three times a day (TID) | ORAL | Status: DC

## 2012-05-10 MED ORDER — METRONIDAZOLE 500 MG PO TABS: 500.0000 mg | ORAL_TABLET | Freq: Two times a day (BID) | ORAL | Status: AC

## 2012-05-10 NOTE — MAU Provider Note (Signed)
CC: Vaginal Pain, Abdominal Pain and Vaginal Discharge    First Provider Initiated Contact with Patient 05/10/12 1339      HPI Amy Cordova is a 27 y.o. [id number] Unknown who presents requesting IUD removal as she feels that is causing her to have this long-standing suprapubic abdominal pain. She states her vaginal discharge is normal but would like STI testing because she has a new sexual partner. She plans to go to [facility] for Nexplanon and further STI testing. She was seen here 5 months ago and having similar concerns with her IUD. At that visit strings were not seen and she had an ultrasound showing that the IUD was still in place. Her plan was to go back to Dr. Ebony Hail office however she missed an appointment there and was told that she should be seen at [facility]  Of note her ultrasound showed an incidental small right ovarian dermoid cyst and followup was recommended for 6 months. LMP none - amenorrheic on IUD. Denies abnormal bleeding.  Past Medical History  Diagnosis Date  . Hypertension   . Cervical cancer   . Abnormal Pap smear 2000    cervical - laser treatment  . Pregnancy induced hypertension   . Headache   . Urinary tract infection   . Trichomonas     OB History    Grav Para Term Preterm Abortions TAB SAB Ect Mult Living   3 2 1 1 1  0 1 0 0 2     # Outc Date GA Lbr Len/2nd Wgt Sex Del Anes PTL Lv   1 SAB 2003           2 TRM 12/04    F SVD  No Yes   Comments: elevated BP   3 PRE 2/08    F SVD  Yes    Comments: hx of cervical cancer prior to preg      Past Surgical History  Procedure Date  . No past surgeries     History   Social History  . Marital Status: Single    Spouse Name: N/A    Number of Children: N/A  . Years of Education: N/A   Occupational History  . Not on file.   Social History Main Topics  . Smoking status: Current Some Day Smoker -- 2 years    Types: Cigarettes  . Smokeless tobacco: Not on file  .  Alcohol Use: Yes     Comment: socially  . Drug Use: No  . Sexually Active: Yes    Birth Control/ Protection: IUD   Other Topics Concern  . Not on file   Social History Narrative  . No narrative on file    No current facility-administered medications on file prior to encounter.   No current outpatient prescriptions on file prior to encounter.    No Known Allergies  ROS Pertinent items in HPI  PHYSICAL EXAM Filed Vitals:   05/10/12 1140  BP: 123/80  Pulse: 70  Temp: 99 F (37.2 C)  Resp: 18   General: Well nourished, well developed female in no acute distress Cardiovascular: Normal rate Respiratory: Normal effort Abdomen: Soft, nontender Back: No CVAT Extremities: No edema Neurologic: Alert and oriented Speculum exam: NEFG; vagina with physiologic discharge, no blood; cervix clean. Strings not seen Bimanual exam: cervix closed, no CMT; uterus NSSP; no adnexal tenderness or masses  LAB RESULTS Results for orders placed during the hospital encounter of 05/10/12 (from the past 24 hour(s))  POCT PREGNANCY,  URINE     Status: Normal   Collection Time   05/10/12 11:53 AM      Component Value Range   Preg Test, Ur NEGATIVE  NEGATIVE  WET PREP, GENITAL     Status: Abnormal   Collection Time   05/10/12  1:45 PM      Component Value Range   Yeast Wet Prep HPF POC NONE SEEN  NONE SEEN   Trich, Wet Prep NONE SEEN  NONE SEEN   Clue Cells Wet Prep HPF POC MODERATE (*) NONE SEEN   WBC, Wet Prep HPF POC FEW (*) NONE SEEN    IMAGING No results found.  MAU COURSE IUD Removal  Patient was in the dorsal lithotomy position, normal external genitalia was noted.  A speculum was placed in the patient's vagina, normal discharge was noted, no lesions. The multiparous cervix was visualized, no lesions, no abnormal discharge; IUD strings were not seen protruding from os, however able to gra strings of the IUD were grasped and pulled using ring forceps.  (The strings of the IUD were not  visualized, so Kelly forceps were introduced into the cervical canal and IUD removed when tip of string blindly grasped.) The IUD was successfully removed in its entirety. Pt tolerated the procedure well.      ASSESSMENT  1. Encounter for IUD removal   2. Abdominal pain   3. Ovarian cyst, right   BV  PLAN Discharge home. See AVS for patient education. F/U [facility] for Nexplanon and further STI tests [facility] F/U for [place] and visit in 1 month Follow-up Information    Follow up with [facility]. (Someone from GYN clinic will call you with an appointment)            Medication List     As of 05/10/2012  2:46 PM    TAKE these medications         BC HEADACHE POWDER PO   Take 1 Package by mouth as needed. During cycle      metroNIDAZOLE 500 MG tablet   Commonly known as: FLAGYL   Take 1 tablet (500 mg total) by mouth 2 (two) times daily.      metroNIDAZOLE 500 MG tablet   Commonly known as: FLAGYL   Take 1 tablet (500 mg total) by mouth 3 (three) times daily.            Danae Orleans, CNM 05/10/2012 1:49 PM

## 2012-05-10 NOTE — MAU Note (Signed)
Patient states she has had a Mirena IUD for about 8 months. Has had problems before with this IUD but is getting worse and she would like to have it taken out. Has been having a pinching feeling in the vagina and abdominal pain. Would like STI testing.

## 2012-05-15 ENCOUNTER — Telehealth: Payer: Self-pay | Admitting: *Deleted

## 2012-05-15 DIAGNOSIS — R102 Pelvic and perineal pain: Secondary | ICD-10-CM

## 2012-05-15 DIAGNOSIS — D369 Benign neoplasm, unspecified site: Secondary | ICD-10-CM

## 2012-05-15 NOTE — Telephone Encounter (Signed)
Called [place] and got an appointment  For 05/20/12 11:15, which patient may call and change if needed

## 2012-05-15 NOTE — Telephone Encounter (Signed)
Called patient back and notified of U/S and F/U appointment made. Patient agrees and satisfied.

## 2012-05-15 NOTE — Telephone Encounter (Addendum)
Message copied by Gerome Apley on Wed May 15, 2012 11:07 AM ------      Message from: POE, DEIRDRE C      Created: Fri May 10, 2012  1:50 PM       Needs [place] in Feb and F/U appt for ovarian dermoid.

## 2012-05-15 NOTE — Telephone Encounter (Signed)
Called Amy Cordova and unable to leave message- heard message the voice mail box is full.

## 2012-05-20 ENCOUNTER — Ambulatory Visit (HOSPITAL_COMMUNITY): Payer: Self-pay | Attending: Obstetrics and Gynecology

## 2012-05-30 ENCOUNTER — Ambulatory Visit: Payer: Self-pay | Admitting: Obstetrics & Gynecology

## 2012-06-19 ENCOUNTER — Ambulatory Visit: Payer: Self-pay | Admitting: Obstetrics & Gynecology

## 2012-11-27 ENCOUNTER — Encounter (HOSPITAL_COMMUNITY): Payer: Self-pay | Admitting: *Deleted

## 2012-11-27 ENCOUNTER — Emergency Department (HOSPITAL_COMMUNITY)
Admission: EM | Admit: 2012-11-27 | Discharge: 2012-11-27 | Disposition: A | Discharge status: Home / Self Care | Payer: Self-pay | Attending: Emergency Medicine | Admitting: Emergency Medicine

## 2012-11-27 DIAGNOSIS — Z79899 Other long term (current) drug therapy: Secondary | ICD-10-CM | POA: Insufficient documentation

## 2012-11-27 DIAGNOSIS — Z8541 Personal history of malignant neoplasm of cervix uteri: Secondary | ICD-10-CM | POA: Insufficient documentation

## 2012-11-27 DIAGNOSIS — I10 Essential (primary) hypertension: Secondary | ICD-10-CM | POA: Insufficient documentation

## 2012-11-27 DIAGNOSIS — Z87440 Personal history of urinary (tract) infections: Secondary | ICD-10-CM | POA: Insufficient documentation

## 2012-11-27 DIAGNOSIS — F17200 Nicotine dependence, unspecified, uncomplicated: Secondary | ICD-10-CM | POA: Insufficient documentation

## 2012-11-27 DIAGNOSIS — Z8742 Personal history of other diseases of the female genital tract: Secondary | ICD-10-CM | POA: Insufficient documentation

## 2012-11-27 DIAGNOSIS — Z3202 Encounter for pregnancy test, result negative: Secondary | ICD-10-CM | POA: Insufficient documentation

## 2012-11-27 DIAGNOSIS — Z7982 Long term (current) use of aspirin: Secondary | ICD-10-CM | POA: Insufficient documentation

## 2012-11-27 DIAGNOSIS — R109 Unspecified abdominal pain: Secondary | ICD-10-CM | POA: Insufficient documentation

## 2012-11-27 DIAGNOSIS — Z8619 Personal history of other infectious and parasitic diseases: Secondary | ICD-10-CM | POA: Insufficient documentation

## 2012-11-27 DIAGNOSIS — N9489 Other specified conditions associated with female genital organs and menstrual cycle: Secondary | ICD-10-CM | POA: Insufficient documentation

## 2012-11-27 LAB — URINALYSIS, ROUTINE W REFLEX MICROSCOPIC
Bilirubin Urine: NEGATIVE
Glucose, UA: NEGATIVE mg/dL
Ketones, ur: NEGATIVE mg/dL
Leukocytes, UA: NEGATIVE
Nitrite: NEGATIVE
Specific Gravity, Urine: 1.025 (ref 1.005–1.030)
pH: 5 (ref 5.0–8.0)

## 2012-11-27 LAB — WET PREP, GENITAL: Yeast Wet Prep HPF POC: NONE SEEN

## 2012-11-27 LAB — GC/CHLAMYDIA PROBE AMP
CT Probe RNA: NEGATIVE
GC Probe RNA: NEGATIVE

## 2012-11-27 MED ORDER — CEFTRIAXONE SODIUM 250 MG IJ SOLR
250.0000 mg | Freq: Once | INTRAMUSCULAR | Status: AC
  Administered 2012-11-27: 250 mg via INTRAMUSCULAR
  Filled 2012-11-27: qty 250

## 2012-11-27 MED ORDER — FLUCONAZOLE 150 MG PO TABS
150.0000 mg | ORAL_TABLET | Freq: Once | ORAL | Status: AC
  Administered 2012-11-27: 150 mg via ORAL
  Filled 2012-11-27: qty 1

## 2012-11-27 MED ORDER — AZITHROMYCIN 250 MG PO TABS
1000.0000 mg | ORAL_TABLET | Freq: Once | ORAL | Status: AC
  Administered 2012-11-27: 1000 mg via ORAL
  Filled 2012-11-27: qty 4

## 2012-11-27 MED ORDER — IBUPROFEN 800 MG PO TABS: 800.0000 mg | ORAL_TABLET | Freq: Three times a day (TID) | ORAL | Status: DC

## 2012-11-27 NOTE — ED Provider Notes (Signed)
CSN: [id number]     Arrival date & time 11/27/12  0725 History     First MD Initiated Contact with Patient 11/27/12 0740     Chief Complaint  Patient presents with  . pelvic cramping, no period x2 months    (Consider location/radiation/quality/duration/timing/severity/associated sxs/prior Treatment) HPI 27 year old female presents complaining of low abdominal pain. Patient reports intermittent crampy lower bowel pain for one week and half. Pain is waxing waning which she noticed more at nighttime. Pain is nonradiating. Yesterday while having sexual intercourse patient noticed some strong odor. She denies fever, chills, chest pain, shortness of breath, back pain, dysuria, vaginal bleeding, vaginal discharge, hematuria, hematochezia, or melena. She denies any pain with sexual activities. She did report having some mild loose stool yesterday without blood or mucus. Patient has not had a menstrual period in 2 months. She is a G2 P2. She had an IUD prior but it was removed in February. She did report that her partner mention of some discomfort "he feels something is pinching him" with sexual activity.  Pt using protection but not every single time.  Patient reports she has a history of cervical cancer diagnosed 12 years ago. Had a laser procedure and regular Q 40-month check up.  Sts she has not return for check up in 2 yrs.     Past Medical History  Diagnosis Date  . Hypertension   . Cervical cancer   . Abnormal Pap smear 2000    cervical - laser treatment  . Pregnancy induced hypertension   . Headache(784.0)   . Urinary tract infection   . Trichomonas    Past Surgical History  Procedure Laterality Date  . No past surgeries     Family History  Problem Relation Age of Onset  . Hypertension Mother   . Diabetes Mother   . Diabetes Father   . Hypertension Father   . Other Neg Hx    History  Substance Use Topics  . Smoking status: Current Some Day Smoker -- 2 years    Types: Cigarettes   . Smokeless tobacco: Not on file  . Alcohol Use: Yes     Comment: socially   OB History   Grav Para Term Preterm Abortions TAB SAB Ect Mult Living   3 2 1 1 1  0 1 0 0 2     Review of Systems  All other systems reviewed and are negative.    Allergies  Review of patient's allergies indicates no known allergies.  Home Medications   Current Outpatient Rx  Name  Route  Sig  Dispense  Refill  . Aspirin-Salicylamide-Caffeine (BC HEADACHE POWDER PO)   Oral   Take 1 Package by mouth as needed. During cycle         . metroNIDAZOLE (FLAGYL) 500 MG tablet   Oral   Take 1 tablet (500 mg total) by mouth 3 (three) times daily.   14 tablet   0    BP 135/80  Pulse 69  Temp(Src) 98.6 F (37 C) (Oral)  Resp 16  SpO2 96% Physical Exam  Nursing note and vitals reviewed. Constitutional: She appears well-developed and well-nourished. No distress.  HENT:  Head: Normocephalic and atraumatic.  Eyes: Conjunctivae are normal.  Neck: Normal range of motion. Neck supple.  Cardiovascular: Normal rate and regular rhythm.   Pulmonary/Chest: Effort normal and breath sounds normal. She exhibits no tenderness.  Abdominal: Soft. There is no tenderness.  Genitourinary: Vagina normal and uterus normal. There is no rash or  lesion on the right labia. There is no rash or lesion on the left labia. Cervix exhibits no motion tenderness, no discharge and no friability. Right adnexum displays tenderness. Right adnexum displays no mass. Left adnexum displays no mass and no tenderness. No erythema, tenderness or bleeding around the vagina. No vaginal discharge found.  Chaperone present  Tenderness to R adnexal region.  No CMT.  No dysplasia or abnormal growth noted on pelvic exam.    Lymphadenopathy:       Right: No inguinal adenopathy present.       Left: No inguinal adenopathy present.    ED Course   Procedures (including critical care time)  8:17 AM Pt with R adnexal tenderness on pelvic exam, no  pelvic mass or skin changes noted.  No McBurney's point concerning for appy. No fever, n/v/d, loss of appetite to suggest appy.  Is sexually active.  Rocephin/zithromax offered prophylactic , pt agrees.    8:34 AM Result from pelvic exam unremarkable.  Pt has been treated for cervicitis with rocephin/zithromax.  GC/Ch culture sent.  Otherwise UA unremarkable, preg test neg.  Return precaution discussed.  Recommend f/u with OBGYN for further check up since pt has hx of cervical cancer.  Care discussed with attending.  Labs Reviewed  WET PREP, GENITAL - Abnormal; Notable for the following:    Clue Cells Wet Prep HPF POC RARE (*)    WBC, Wet Prep HPF POC RARE (*)    All other components within normal limits  URINALYSIS, ROUTINE W REFLEX MICROSCOPIC - Abnormal; Notable for the following:    APPearance CLOUDY (*)    All other components within normal limits  GC/CHLAMYDIA PROBE AMP  POCT PREGNANCY, URINE   No results found. 1. Lower abdominal pain, right     MDM  BP 135/80  Pulse 69  Temp(Src) 98.6 F (37 C) (Oral)  Resp 16  SpO2 96%    Fayrene Helper, PA-C 11/27/12 [phone]

## 2012-11-27 NOTE — Progress Notes (Signed)
[facility] did not get to see patient but will be sending her information about the [facility], using the address provided.

## 2012-11-27 NOTE — ED Notes (Signed)
MD at bedside. 

## 2012-11-27 NOTE — ED Notes (Addendum)
Hx cervical cancer at 16 years ago. Pt reports she has not been following up like she is suppose to. Has not been to check up x1 year. Pt reports she has not had menstrual cycle x2 months, pt is sexually active. Pt reports pelvic/lower abdominal cramping pain x3 days. Pain 7/10. Pt denies vaginal discharge, reports noticing some odor. Denies dysuria, n/v.  Diarrhea x1 day.   Pt reports she had mirena, was taken out in February. Pt reports her partner says during intercourse "he feels something pinching him". He has felt this while mirena was implanted and after it was taken out.

## 2012-11-27 NOTE — ED Provider Notes (Signed)
Medical screening examination/treatment/procedure(s) were performed by non-physician practitioner and as supervising physician I was immediately available for consultation/collaboration.   Richardean Canal, MD 11/27/12 [phone]

## 2012-12-14 ENCOUNTER — Telehealth (HOSPITAL_COMMUNITY): Payer: Self-pay | Admitting: Emergency Medicine

## 2013-03-28 ENCOUNTER — Encounter (HOSPITAL_COMMUNITY): Payer: Self-pay | Admitting: Family

## 2013-03-28 ENCOUNTER — Inpatient Hospital Stay (HOSPITAL_COMMUNITY)
Admission: AD | Admit: 2013-03-28 | Discharge: 2013-03-28 | Disposition: A | Discharge status: Home / Self Care | Payer: Medicaid Other | Source: Ambulatory Visit | Attending: Obstetrics and Gynecology | Admitting: Obstetrics and Gynecology

## 2013-03-28 ENCOUNTER — Inpatient Hospital Stay (HOSPITAL_COMMUNITY): Payer: Medicaid Other

## 2013-03-28 DIAGNOSIS — O2390 Unspecified genitourinary tract infection in pregnancy, unspecified trimester: Secondary | ICD-10-CM | POA: Insufficient documentation

## 2013-03-28 DIAGNOSIS — N760 Acute vaginitis: Secondary | ICD-10-CM

## 2013-03-28 DIAGNOSIS — N949 Unspecified condition associated with female genital organs and menstrual cycle: Secondary | ICD-10-CM | POA: Insufficient documentation

## 2013-03-28 DIAGNOSIS — O26899 Other specified pregnancy related conditions, unspecified trimester: Secondary | ICD-10-CM

## 2013-03-28 DIAGNOSIS — R109 Unspecified abdominal pain: Secondary | ICD-10-CM | POA: Insufficient documentation

## 2013-03-28 DIAGNOSIS — B9689 Other specified bacterial agents as the cause of diseases classified elsewhere: Secondary | ICD-10-CM | POA: Insufficient documentation

## 2013-03-28 DIAGNOSIS — A499 Bacterial infection, unspecified: Secondary | ICD-10-CM | POA: Insufficient documentation

## 2013-03-28 LAB — CBC
Hemoglobin: 12.1 g/dL (ref 12.0–15.0)
MCH: 31.6 pg (ref 26.0–34.0)
RBC: 3.83 MIL/uL — ABNORMAL LOW (ref 3.87–5.11)

## 2013-03-28 LAB — URINALYSIS, ROUTINE W REFLEX MICROSCOPIC
Bilirubin Urine: NEGATIVE
Hgb urine dipstick: NEGATIVE
Ketones, ur: NEGATIVE mg/dL
Leukocytes, UA: NEGATIVE
Protein, ur: NEGATIVE mg/dL
Specific Gravity, Urine: >1.03 — ABNORMAL HIGH (ref 1.005–1.030)

## 2013-03-28 LAB — HCG, QUANTITATIVE, PREGNANCY: hCG, Beta Chain, Quant, S: 541 m[IU]/mL — ABNORMAL HIGH (ref <5)

## 2013-03-28 LAB — WET PREP, GENITAL: Yeast Wet Prep HPF POC: NONE SEEN

## 2013-03-28 LAB — POCT PREGNANCY, URINE: Preg Test, Ur: POSITIVE — AB

## 2013-03-28 MED ORDER — FLUCONAZOLE 150 MG PO TABS: 150.0000 mg | ORAL_TABLET | Freq: Once | ORAL | Status: AC

## 2013-03-28 MED ORDER — METRONIDAZOLE 500 MG PO TABS: 500.0000 mg | ORAL_TABLET | Freq: Two times a day (BID) | ORAL | Status: DC

## 2013-03-28 NOTE — MAU Provider Note (Signed)
Chief Complaint: Possible Pregnancy   First Provider Initiated Contact with Patient 03/28/13 1647     SUBJECTIVE HPI: Amy Cordova is a 27 y.o. [id number] at [redacted]w[redacted]d by unusual LMP who presents with cramping since yesterday and vaginal discharge w/out odor. No other testing this pregnancy. Last normal period 01/22/2013. Has short, light period x 3 days 02/21/2013. Denies fever, chills, vaginal bleeding, urinary complaints, GI complaints except for nausea.   Past Medical History  Diagnosis Date  . Hypertension   . Cervical cancer   . Abnormal Pap smear 2000    cervical - laser treatment  . Pregnancy induced hypertension   . Headache(784.0)   . Urinary tract infection   . Trichomonas    OB History  Gravida Para Term Preterm AB SAB TAB Ectopic Multiple Living  4 2 1 1 1 1  0 0 0 2    # Outcome Date GA Lbr Len/2nd Weight Sex Delivery Anes PTL Lv  4 CUR           3 PRE 06/09/06    F SVD  Y      Comments: hx of cervical cancer prior to preg  2 TRM 04/17/03    F SVD  N Y     Comments: elevated BP  1 SAB 2003             Past Surgical History  Procedure Laterality Date  . No past surgeries    . Laser ablation condyloma cervical / vulvar     History   Social History  . Marital Status: Single    Spouse Name: N/A    Number of Children: N/A  . Years of Education: N/A   Occupational History  . Not on file.   Social History Main Topics  . Smoking status: Current Some Day Smoker -- 2 years    Types: Cigarettes  . Smokeless tobacco: Not on file  . Alcohol Use: Yes     Comment: socially  . Drug Use: No  . Sexual Activity: Yes    Birth Control/ Protection: None   Other Topics Concern  . Not on file   Social History Narrative  . No narrative on file   No current facility-administered medications on file prior to encounter.   No current outpatient prescriptions on file prior to encounter.   Allergies  Allergen Reactions  . Other Other (See Comments)    Pt tends to get yeast  infections from antibiotics. She can take antibiotics as long as she is given fluconazole.    ROS: Pertinent items in HPI  OBJECTIVE Blood pressure 122/75, pulse 78, temperature 98 F (36.7 C), temperature source Oral, resp. rate 16, last menstrual period 02/21/2013. GENERAL: Well-developed, well-nourished female in no acute distress.  HEENT: Normocephalic HEART: normal rate RESP: normal effort ABDOMEN: Soft, non-tender. No CVAT.  EXTREMITIES: Nontender, no edema NEURO: Alert and oriented SPECULUM EXAM: NEFG, moderate amount of creamy, white, mildly malodorous discharge, no blood noted, cervix clean BIMANUAL: cervix closed; uterus ? Slightly enlarged size, no adnexal tenderness or masses. No CMT.   LAB RESULTS Results for orders placed during the hospital encounter of 03/28/13 (from the past 24 hour(s))  URINALYSIS, ROUTINE W REFLEX MICROSCOPIC     Status: Abnormal   Collection Time    03/28/13  4:00 PM      Result Value Range   Color, Urine YELLOW  YELLOW   APPearance CLEAR  CLEAR   Specific Gravity, Urine >1.030 (*) 1.005 - 1.030  pH 6.0  5.0 - 8.0   Glucose, UA NEGATIVE  NEGATIVE mg/dL   Hgb urine dipstick NEGATIVE  NEGATIVE   Bilirubin Urine NEGATIVE  NEGATIVE   Ketones, ur NEGATIVE  NEGATIVE mg/dL   Protein, ur NEGATIVE  NEGATIVE mg/dL   Urobilinogen, UA 0.2  0.0 - 1.0 mg/dL   Nitrite NEGATIVE  NEGATIVE   Leukocytes, UA NEGATIVE  NEGATIVE  POCT PREGNANCY, URINE     Status: Abnormal   Collection Time    03/28/13  4:10 PM      Result Value Range   Preg Test, Ur POSITIVE (*) NEGATIVE  WET PREP, GENITAL     Status: Abnormal   Collection Time    03/28/13  5:00 PM      Result Value Range   Yeast Wet Prep HPF POC NONE SEEN  NONE SEEN   Trich, Wet Prep NONE SEEN  NONE SEEN   Clue Cells Wet Prep HPF POC MANY (*) NONE SEEN   WBC, Wet Prep HPF POC FEW (*) NONE SEEN    IMAGING US Ob Comp Less 14 Wks  03/28/2013   CLINICAL DATA:  27 year old pregnant female with pelvic  pain.  EXAM: OBSTETRIC <14 WK [place] AND TRANSVAGINAL OB US  TECHNIQUE: Both transabdominal and transvaginal ultrasound examinations were performed for complete evaluation of the gestation as well as the maternal uterus, adnexal regions, and pelvic cul-de-sac. Transvaginal technique was performed to assess early pregnancy.  COMPARISON:  None.  FINDINGS: Intrauterine gestational sac: A tiny cystic structure within the uterus likely represents a gestational sac.  Yolk sac:  Not visualized  Embryo:  Not visualized  Cardiac Activity: Not visualized  MSD:  2.7  mm   4 w   5  d             [place] EDC: 11/30/2013  Maternal uterus/adnexae: A trace amount of free pelvic fluid is noted.  A 1.4 x 1.4 x 1.6 cm echogenic mass within the right ovary is compatible with a dermoid.  No other ovarian abnormalities are noted.  IMPRESSION: Probable early intrauterine gestational sac, but no yolk sac, fetal pole, or cardiac activity yet visualized. Mean sac diameter corresponds to a gestational age of [redacted] weeks 5 days.  Recommend follow-up quantitative B-HCG levels and follow-up US in 14 days to confirm and assess viability. This recommendation follows SRU consensus guidelines: Diagnostic Criteria for Nonviable Pregnancy Early in the First Trimester. Malva Limes Med 2013; 161:0960-45.  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   Electronically Signed   By: Laveda Abbe M.D.   On: 03/28/2013 17:40   US Ob Transvaginal  03/28/2013   CLINICAL DATA:  27 year old pregnant female with pelvic pain.  EXAM: OBSTETRIC <14 WK [place] AND TRANSVAGINAL OB US  TECHNIQUE: Both transabdominal and transvaginal ultrasound examinations were performed for complete evaluation of the gestation as well as the maternal uterus, adnexal regions, and pelvic cul-de-sac. Transvaginal technique was performed to assess early pregnancy.  COMPARISON:  None.  FINDINGS: Intrauterine gestational sac: A tiny cystic structure within the uterus likely represents a gestational sac.  Yolk sac:   Not visualized  Embryo:  Not visualized  Cardiac Activity: Not visualized  MSD:  2.7  mm   4 w   5  d             [place] EDC: 11/30/2013  Maternal uterus/adnexae: A trace amount of free pelvic fluid is noted.  A 1.4 x 1.4 x 1.6 cm  echogenic mass within the right ovary is compatible with a dermoid.  No other ovarian abnormalities are noted.  IMPRESSION: Probable early intrauterine gestational sac, but no yolk sac, fetal pole, or cardiac activity yet visualized. Mean sac diameter corresponds to a gestational age of [redacted] weeks 5 days.  Recommend follow-up quantitative B-HCG levels and follow-up US in 14 days to confirm and assess viability. This recommendation follows SRU consensus guidelines: Diagnostic Criteria for Nonviable Pregnancy Early in the First Trimester. Malva Limes Med 2013; 161:0960-45.  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   Electronically Signed   By: Laveda Abbe M.D.   On: 03/28/2013 17:40    MAU COURSE  ASSESSMENT 1. Abdominal cramping complicating pregnancy, antepartum   2. BV (bacterial vaginosis)     PLAN Discharge home in stable condition. SAB and ectopic precautions.  Pregnancy verification letter given.     Follow-up Information   Follow up with [facility] [place] [facility] In 2 days. (for repeat bloodwork or sooner as needed if symptoms worsen)    Contact information:   [address] [id number] [place] [address] [phone]       Medication List         fluconazole 150 MG tablet  Commonly known as:  DIFLUCAN  Take 1 tablet (150 mg total) by mouth once. Take one tablet as needed for yeast infection. If no relief in 3 days, take second tablet.     metroNIDAZOLE 500 MG tablet  Commonly known as:  FLAGYL  Take 1 tablet (500 mg total) by mouth 2 (two) times daily.       [place], CNM 03/28/2013  5:52 PM

## 2013-03-28 NOTE — MAU Note (Signed)
27 yo presents to MAU with c/o +HPT; reports increased white vaginal discharge. Denies vaginal itching, burning, irritation. Reports light period in October, with last regular period on 9/24. No pain.

## 2013-03-29 NOTE — MAU Provider Note (Signed)
Attestation of Attending Supervision of Advanced Practitioner: Evaluation and management procedures were performed by the PA/NP/CNM/OB Fellow under my supervision/collaboration. Chart reviewed and agree with management and plan.  Syd Newsome V 03/29/2013 10:18 PM

## 2013-03-30 ENCOUNTER — Inpatient Hospital Stay (HOSPITAL_COMMUNITY)
Admission: AD | Admit: 2013-03-30 | Discharge: 2013-03-30 | Disposition: A | Discharge status: Home / Self Care | Payer: Medicaid Other | Source: Ambulatory Visit | Attending: Obstetrics and Gynecology | Admitting: Obstetrics and Gynecology

## 2013-03-30 DIAGNOSIS — R109 Unspecified abdominal pain: Secondary | ICD-10-CM | POA: Insufficient documentation

## 2013-03-30 DIAGNOSIS — O26899 Other specified pregnancy related conditions, unspecified trimester: Secondary | ICD-10-CM

## 2013-03-30 DIAGNOSIS — O99891 Other specified diseases and conditions complicating pregnancy: Secondary | ICD-10-CM | POA: Insufficient documentation

## 2013-03-30 LAB — HCG, QUANTITATIVE, PREGNANCY: hCG, Beta Chain, Quant, S: 1122 m[IU]/mL — ABNORMAL HIGH (ref <5)

## 2013-03-30 MED ORDER — PRENATAL PLUS 27-1 MG PO TABS: 1.0000 | ORAL_TABLET | Freq: Every day | ORAL | Status: DC

## 2013-03-30 NOTE — MAU Note (Signed)
Pt here for f/u BHCG only, denies pain or bleeding.

## 2013-03-30 NOTE — MAU Provider Note (Signed)
Attestation of Attending Supervision of Advanced Practitioner: Evaluation and management procedures were performed by the PA/NP/CNM/OB Fellow under my supervision/collaboration. Chart reviewed and agree with management and plan.  Matteson Blue V 03/30/2013 9:34 PM

## 2013-03-30 NOTE — MAU Provider Note (Signed)
CC: Follow-up BHCG        HPI Amy Cordova is a 27 y.o. [id number] [redacted]w[redacted]d who Was initially seen here 03/28/2013 for cramping lower abdominal pain which is now resolved. She's had no bleeding. Quantitative beta hCG was 541. Ultrasound showed possible IUGS. She's had minimal nausea without vomiting. She had moderate clue cells on wet prep but not treated and not symptomatic. LMP 02/21/2013.  Past Medical History  Diagnosis Date  . Hypertension   . Cervical cancer   . Abnormal Pap smear 2000    cervical - laser treatment  . Pregnancy induced hypertension   . Headache(784.0)   . Urinary tract infection   . Trichomonas     OB History  Gravida Para Term Preterm AB SAB TAB Ectopic Multiple Living  4 2 1 1 1 1  0 0 0 2    # Outcome Date GA Lbr Len/2nd Weight Sex Delivery Anes PTL Lv  4 CUR           3 PRE 06/09/06    F SVD  Y      Comments: hx of cervical cancer prior to preg  2 TRM 04/17/03    F SVD  N Y     Comments: elevated BP  1 SAB 2003              Past Surgical History  Procedure Laterality Date  . Laser ablation condyloma cervical / vulvar      History   Social History  . Marital Status: Single    Spouse Name: N/A    Number of Children: N/A  . Years of Education: N/A   Occupational History  . Not on file.   Social History Main Topics  . Smoking status: Current Some Day Smoker -- 2 years    Types: Cigarettes  . Smokeless tobacco: Not on file  . Alcohol Use: Yes     Comment: socially  . Drug Use: No  . Sexual Activity: Yes   Other Topics Concern  . Not on file   Social History Narrative  . No narrative on file    No current facility-administered medications on file prior to encounter.   Current Outpatient Prescriptions on File Prior to Encounter  Medication Sig Dispense Refill  . fluconazole (DIFLUCAN) 150 MG tablet Take 1 tablet (150 mg total) by mouth once. Take one tablet as needed for yeast infection. If no relief in 3 days, take second tablet.   2 tablet  0  . metroNIDAZOLE (FLAGYL) 500 MG tablet Take 1 tablet (500 mg total) by mouth 2 (two) times daily.  14 tablet  0    Allergies  Allergen Reactions  . Other Other (See Comments)    Pt tends to get yeast infections from antibiotics. She can take antibiotics as long as she is given fluconazole.    ROS Pertinent items in HPI  PHYSICAL EXAM Filed Vitals:   03/30/13 1737  BP: 114/66  Pulse: 70  Temp: 98.2 F (36.8 C)  Resp: 18   General: Well nourished, well developed female in no acute distress Cardiovascular: Normal rate Respiratory: Normal effort Abdomen: Soft, nontender LAB RESULTS Results for orders placed during the hospital encounter of 03/30/13 (from the past 24 hour(s))  HCG, QUANTITATIVE, PREGNANCY     Status: Abnormal   Collection Time    03/30/13  5:40 PM      Result Value Range   hCG, Beta Chain, Quant, S 1122 (*) <5 mIU/mL    IMAGING  US Ob Comp Less 14 Wks  03/28/2013   CLINICAL DATA:  27 year old pregnant female with pelvic pain.  EXAM: OBSTETRIC <14 WK [place] AND TRANSVAGINAL OB US  TECHNIQUE: Both transabdominal and transvaginal ultrasound examinations were performed for complete evaluation of the gestation as well as the maternal uterus, adnexal regions, and pelvic cul-de-sac. Transvaginal technique was performed to assess early pregnancy.  COMPARISON:  None.  FINDINGS: Intrauterine gestational sac: A tiny cystic structure within the uterus likely represents a gestational sac.  Yolk sac:  Not visualized  Embryo:  Not visualized  Cardiac Activity: Not visualized  MSD:  2.7  mm   4 w   5  d             [place] EDC: 11/30/2013  Maternal uterus/adnexae: A trace amount of free pelvic fluid is noted.  A 1.4 x 1.4 x 1.6 cm echogenic mass within the right ovary is compatible with a dermoid.  No other ovarian abnormalities are noted.  IMPRESSION: Probable early intrauterine gestational sac, but no yolk sac, fetal pole, or cardiac activity yet visualized. Mean sac diameter  corresponds to a gestational age of [redacted] weeks 5 days.  Recommend follow-up quantitative B-HCG levels and follow-up US in 14 days to confirm and assess viability. This recommendation follows SRU consensus guidelines: Diagnostic Criteria for Nonviable Pregnancy Early in the First Trimester. Malva Limes Med 2013; 981:1914-78.  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   Electronically Signed   By: Laveda Abbe M.D.   On: 03/28/2013 17:40   US Ob Transvaginal  03/28/2013   CLINICAL DATA:  27 year old pregnant female with pelvic pain.  EXAM: OBSTETRIC <14 WK [place] AND TRANSVAGINAL OB US  TECHNIQUE: Both transabdominal and transvaginal ultrasound examinations were performed for complete evaluation of the gestation as well as the maternal uterus, adnexal regions, and pelvic cul-de-sac. Transvaginal technique was performed to assess early pregnancy.  COMPARISON:  None.  FINDINGS: Intrauterine gestational sac: A tiny cystic structure within the uterus likely represents a gestational sac.  Yolk sac:  Not visualized  Embryo:  Not visualized  Cardiac Activity: Not visualized  MSD:  2.7  mm   4 w   5  d             [place] EDC: 11/30/2013  Maternal uterus/adnexae: A trace amount of free pelvic fluid is noted.  A 1.4 x 1.4 x 1.6 cm echogenic mass within the right ovary is compatible with a dermoid.  No other ovarian abnormalities are noted.  IMPRESSION: Probable early intrauterine gestational sac, but no yolk sac, fetal pole, or cardiac activity yet visualized. Mean sac diameter corresponds to a gestational age of [redacted] weeks 5 days.  Recommend follow-up quantitative B-HCG levels and follow-up US in 14 days to confirm and assess viability. This recommendation follows SRU consensus guidelines: Diagnostic Criteria for Nonviable Pregnancy Early in the First Trimester. Malva Limes Med 2013; 295:6213-08.  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   Electronically Signed   By: Laveda Abbe M.D.   On: 03/28/2013 17:40    MAU COURSE   ASSESSMENT  1.  Abdominal pain complicating pregnancy, antepartum   Appropriate rise in quantitative beta hCG  PLAN Discharge home. See AVS for patient education. Follow-up Information   Follow up with Nursepractioner Mau, NP. ([place] Dept will call you with appointment and see MAU provider following ultrasound)    Contact information:   [phone]  Medication List         fluconazole 150 MG tablet  Commonly known as:  DIFLUCAN  Take 1 tablet (150 mg total) by mouth once. Take one tablet as needed for yeast infection. If no relief in 3 days, take second tablet.     metroNIDAZOLE 500 MG tablet  Commonly known as:  FLAGYL  Take 1 tablet (500 mg total) by mouth 2 (two) times daily.     prenatal vitamin w/FE, FA 27-1 MG Tabs tablet  Take 1 tablet by mouth daily.       Follow-up Information   Follow up with Nursepractioner Mau, NP. ([place] Dept will call you with appointment and see MAU provider following ultrasound)    Contact information:   [phone]        Danae Orleans, CNM 03/30/2013 6:57 PM

## 2013-04-11 ENCOUNTER — Ambulatory Visit (HOSPITAL_COMMUNITY)
Admission: RE | Admit: 2013-04-11 | Discharge: 2013-04-11 | Disposition: A | Discharge status: Home / Self Care | Payer: Medicaid Other | Source: Ambulatory Visit | Attending: Obstetrics and Gynecology | Admitting: Obstetrics and Gynecology

## 2013-04-11 ENCOUNTER — Other Ambulatory Visit (HOSPITAL_COMMUNITY): Payer: Self-pay | Admitting: Obstetrics and Gynecology

## 2013-04-11 DIAGNOSIS — R109 Unspecified abdominal pain: Secondary | ICD-10-CM | POA: Insufficient documentation

## 2013-04-11 DIAGNOSIS — O99891 Other specified diseases and conditions complicating pregnancy: Secondary | ICD-10-CM | POA: Insufficient documentation

## 2013-04-11 DIAGNOSIS — Z3689 Encounter for other specified antenatal screening: Secondary | ICD-10-CM | POA: Insufficient documentation

## 2013-04-11 DIAGNOSIS — O26899 Other specified pregnancy related conditions, unspecified trimester: Secondary | ICD-10-CM

## 2013-05-01 NOTE — L&D Delivery Note (Signed)
Delivery Note Patient is 28 y.o. [phone] [redacted]w[redacted]d presenting in active labor, desires BTL, breast and bottle, circ at outside clinic  At 12:06 PM a viable female was delivered via Vaginal, Spontaneous Delivery (Presentation: ; Occiput Anterior).  APGAR: 9, 9; weight .   Placenta status: Intact, Spontaneous.  Cord: 3 vessels with the following complications: None.  Anesthesia: None  Episiotomy: None Lacerations: None Suture Repair: n/a Est. Blood Loss (mL): 350  Mom to postpartum.  Baby to Couplet care / Skin to Skin.  Amy Cordova ROCIO 11/24/2013, 12:27 PM

## 2013-05-07 ENCOUNTER — Ambulatory Visit (INDEPENDENT_AMBULATORY_CARE_PROVIDER_SITE_OTHER): Payer: Medicaid Other | Admitting: Advanced Practice Midwife

## 2013-05-07 ENCOUNTER — Encounter: Payer: Self-pay | Admitting: Advanced Practice Midwife

## 2013-05-07 VITALS — BP 112/75 | Temp 98.0°F | Wt 202.3 lb

## 2013-05-07 DIAGNOSIS — O99340 Other mental disorders complicating pregnancy, unspecified trimester: Secondary | ICD-10-CM

## 2013-05-07 DIAGNOSIS — Z3480 Encounter for supervision of other normal pregnancy, unspecified trimester: Secondary | ICD-10-CM

## 2013-05-07 LAB — POCT URINALYSIS DIP (DEVICE)
Bilirubin Urine: NEGATIVE
Glucose, UA: NEGATIVE mg/dL
HGB URINE DIPSTICK: NEGATIVE
Ketones, ur: NEGATIVE mg/dL
LEUKOCYTES UA: NEGATIVE
NITRITE: NEGATIVE
PH: 7 (ref 5.0–8.0)
Protein, ur: NEGATIVE mg/dL
Specific Gravity, Urine: 1.02 (ref 1.005–1.030)
UROBILINOGEN UA: 0.2 mg/dL (ref 0.0–1.0)

## 2013-05-07 LAB — HIV ANTIBODY (ROUTINE TESTING W REFLEX): HIV: NONREACTIVE

## 2013-05-07 MED ORDER — ONDANSETRON HCL 4 MG PO TABS: 4.0000 mg | ORAL_TABLET | Freq: Three times a day (TID) | ORAL | Status: DC | PRN

## 2013-05-07 MED ORDER — GLYCOPYRROLATE 2 MG PO TABS: 2.0000 mg | ORAL_TABLET | Freq: Three times a day (TID) | ORAL | Status: DC | PRN

## 2013-05-07 MED ORDER — PROMETHAZINE HCL 25 MG PO TABS: 12.5000 mg | ORAL_TABLET | Freq: Four times a day (QID) | ORAL | Status: DC | PRN

## 2013-05-07 MED ORDER — PRENATAL PLUS 27-1 MG PO TABS: 1.0000 | ORAL_TABLET | Freq: Every day | ORAL | Status: DC

## 2013-05-07 NOTE — Progress Notes (Signed)
Pulse- 68 Patient reports occasional cramp in lower abdomen/pelvic

## 2013-05-07 NOTE — Progress Notes (Signed)
   Subjective:    Amy Cordova is a [id number] [redacted]w[redacted]d being seen today for her first obstetrical visit.  Her obstetrical history is significant for 1 previous term, and one preterm vaginal birth. Patient does intend to breast feed. Pregnancy history fully reviewed.  Patient reports no complaints.  Filed Vitals:   05/07/13 0812  BP: 112/75  Temp: 98 F (36.7 C)  Weight: 202 lb 4.8 oz (91.763 kg)    HISTORY: OB History  Gravida Para Term Preterm AB SAB TAB Ectopic Multiple Living  4 2 1 1 1 1  0 0 0 2    # Outcome Date GA Lbr Len/2nd Weight Sex Delivery Anes PTL Lv  4 CUR           3 PRE 06/09/06    F SVD  Y      Comments: hx of cervical cancer prior to preg  2 TRM 04/17/03    F SVD  N Y     Comments: elevated BP  1 SAB 2003             Past Medical History  Diagnosis Date  . Hypertension   . Cervical cancer   . Abnormal Pap smear 2000    cervical - laser treatment  . Pregnancy induced hypertension   . Headache(784.0)   . Urinary tract infection   . Trichomonas    Past Surgical History  Procedure Laterality Date  . Laser ablation condyloma cervical / vulvar     Family History  Problem Relation Age of Onset  . Hypertension Mother   . Diabetes Mother   . Diabetes Father   . Hypertension Father   . Other Neg Hx      Exam    Uterus:   11 week size  Pelvic Exam:    Perineum: No Hemorrhoids, Normal Perineum   Vulva: normal   Vagina:  normal mucosa, normal discharge   pH:    Cervix: multiparous appearance, no cervical motion tenderness and no lesions   Adnexa: normal adnexa and no mass, fullness, tenderness   Bony Pelvis: average  System: Breast:  normal appearance, no masses or tenderness   Skin: normal coloration and turgor, no rashes    Neurologic: oriented, normal, gait normal; reflexes normal and symmetric   Extremities: normal strength, tone, and muscle mass, ROM of all joints is normal   HEENT neck supple with midline trachea and thyroid without masses    Mouth/Teeth mucous membranes moist, pharynx normal without lesions   Neck supple and no masses   Cardiovascular: regular rate and rhythm   Respiratory:  appears well, vitals normal, no respiratory distress, acyanotic, normal RR, ear and throat exam is normal, neck free of mass or lymphadenopathy, chest clear, no wheezing, crepitations, rhonchi, normal symmetric air entry   Abdomen: soft, non-tender; bowel sounds normal; no masses,  no organomegaly   Urinary: urethral meatus normal      Assessment:    Pregnancy: [id number] Patient Active Problem List   Diagnosis Date Noted  . Supervision of normal subsequent pregnancy 05/07/2013        Plan:     Initial labs drawn. Prenatal vitamins. Problem list reviewed and updated. Genetic Screening discussed First Screen: ordered.  Ultrasound discussed; fetal survey: requested.  Follow up in 4 weeks. 50% of 30 min visit spent on counseling and coordination of care.     LEFTWICH-KIRBY, Keilynn Marano 05/07/2013

## 2013-05-08 LAB — OBSTETRIC PANEL
Antibody Screen: NEGATIVE
BASOS ABS: 0 10*3/uL (ref 0.0–0.1)
BASOS PCT: 0 % (ref 0–1)
EOS ABS: 0 10*3/uL (ref 0.0–0.7)
Eosinophils Relative: 0 % (ref 0–5)
HCT: 39.1 % (ref 36.0–46.0)
HEP B S AG: NEGATIVE
Hemoglobin: 13.2 g/dL (ref 12.0–15.0)
Lymphocytes Relative: 23 % (ref 12–46)
Lymphs Abs: 1.3 10*3/uL (ref 0.7–4.0)
MCH: 32.4 pg (ref 26.0–34.0)
MCHC: 33.8 g/dL (ref 30.0–36.0)
MCV: 95.8 fL (ref 78.0–100.0)
Monocytes Absolute: 0.6 10*3/uL (ref 0.1–1.0)
Monocytes Relative: 11 % (ref 3–12)
NEUTROS ABS: 3.6 10*3/uL (ref 1.7–7.7)
NEUTROS PCT: 66 % (ref 43–77)
PLATELETS: 233 10*3/uL (ref 150–400)
RBC: 4.08 MIL/uL (ref 3.87–5.11)
RDW: 13.2 % (ref 11.5–15.5)
RH TYPE: POSITIVE
Rubella: 1.85 Index — ABNORMAL HIGH (ref <0.90)
WBC: 5.5 10*3/uL (ref 4.0–10.5)

## 2013-05-08 LAB — GC/CHLAMYDIA PROBE AMP
CT Probe RNA: NEGATIVE
GC Probe RNA: NEGATIVE

## 2013-05-08 LAB — GLUCOSE TOLERANCE, 1 HOUR (50G) W/O FASTING: Glucose, 1 Hour GTT: 83 mg/dL (ref 70–140)

## 2013-05-09 LAB — CANNABANOIDS (GC/LC/MS), URINE: THC-COOH UR CONFIRM: 81 ng/mL — AB

## 2013-05-09 LAB — PRESCRIPTION MONITORING PROFILE (19 PANEL)
Amphetamine/Meth: NEGATIVE ng/mL
Barbiturate Screen, Urine: NEGATIVE ng/mL
Benzodiazepine Screen, Urine: NEGATIVE ng/mL
Buprenorphine, Urine: NEGATIVE ng/mL
Carisoprodol, Urine: NEGATIVE ng/mL
Cocaine Metabolites: NEGATIVE ng/mL
Creatinine, Urine: 152.82 mg/dL (ref >20.0)
Fentanyl, Ur: NEGATIVE ng/mL
MDMA URINE: NEGATIVE ng/mL
Meperidine, Ur: NEGATIVE ng/mL
Methadone Screen, Urine: NEGATIVE ng/mL
Methaqualone: NEGATIVE ng/mL
Nitrites, Initial: NEGATIVE ug/mL
Opiate Screen, Urine: NEGATIVE ng/mL
Oxycodone Screen, Ur: NEGATIVE ng/mL
Phencyclidine, Ur: NEGATIVE ng/mL
Propoxyphene: NEGATIVE ng/mL
Tapentadol, urine: NEGATIVE ng/mL
Tramadol Scrn, Ur: NEGATIVE ng/mL
Zolpidem, Urine: NEGATIVE ng/mL
pH, Initial: 7.2 pH (ref 4.5–8.9)

## 2013-05-09 LAB — HEMOGLOBINOPATHY EVALUATION
Hemoglobin Other: 0 %
Hgb A2 Quant: 2.6 % (ref 2.2–3.2)
Hgb A: 97.4 % (ref 96.8–97.8)
Hgb F Quant: 0 % (ref 0.0–2.0)
Hgb S Quant: 0 %

## 2013-05-09 LAB — CULTURE, OB URINE: Colony Count: 15000

## 2013-05-10 ENCOUNTER — Encounter: Payer: Self-pay | Admitting: Advanced Practice Midwife

## 2013-05-10 DIAGNOSIS — O99321 Drug use complicating pregnancy, first trimester: Secondary | ICD-10-CM | POA: Insufficient documentation

## 2013-05-23 ENCOUNTER — Other Ambulatory Visit (HOSPITAL_COMMUNITY): Payer: Medicaid Other

## 2013-05-23 ENCOUNTER — Ambulatory Visit (HOSPITAL_COMMUNITY): Payer: Medicaid Other

## 2013-06-04 ENCOUNTER — Ambulatory Visit (INDEPENDENT_AMBULATORY_CARE_PROVIDER_SITE_OTHER): Payer: Medicaid Other | Admitting: Family Medicine

## 2013-06-04 ENCOUNTER — Encounter: Payer: Self-pay | Admitting: Obstetrics and Gynecology

## 2013-06-04 ENCOUNTER — Encounter: Payer: Self-pay | Admitting: Family Medicine

## 2013-06-04 VITALS — BP 109/75 | Temp 98.4°F | Wt 211.0 lb

## 2013-06-04 DIAGNOSIS — Z3480 Encounter for supervision of other normal pregnancy, unspecified trimester: Secondary | ICD-10-CM

## 2013-06-04 DIAGNOSIS — O09899 Supervision of other high risk pregnancies, unspecified trimester: Secondary | ICD-10-CM | POA: Insufficient documentation

## 2013-06-04 DIAGNOSIS — O99321 Drug use complicating pregnancy, first trimester: Secondary | ICD-10-CM

## 2013-06-04 DIAGNOSIS — O09219 Supervision of pregnancy with history of pre-term labor, unspecified trimester: Secondary | ICD-10-CM

## 2013-06-04 DIAGNOSIS — O99340 Other mental disorders complicating pregnancy, unspecified trimester: Secondary | ICD-10-CM

## 2013-06-04 LAB — POCT URINALYSIS DIP (DEVICE)
Bilirubin Urine: NEGATIVE
Glucose, UA: NEGATIVE mg/dL
HGB URINE DIPSTICK: NEGATIVE
Ketones, ur: NEGATIVE mg/dL
Leukocytes, UA: NEGATIVE
Nitrite: NEGATIVE
PROTEIN: NEGATIVE mg/dL
Specific Gravity, Urine: 1.02 (ref 1.005–1.030)
UROBILINOGEN UA: 0.2 mg/dL (ref 0.0–1.0)
pH: 7 (ref 5.0–8.0)

## 2013-06-04 NOTE — Patient Instructions (Signed)
Second Trimester of Pregnancy The second trimester is from week 13 through week 28, months 4 through 6. The second trimester is often a time when you feel your best. Your body has also adjusted to being pregnant, and you begin to feel better physically. Usually, morning sickness has lessened or quit completely, you may have more energy, and you may have an increase in appetite. The second trimester is also a time when the fetus is growing rapidly. At the end of the sixth month, the fetus is about 9 inches long and weighs about 1 pounds. You will likely begin to feel the baby move (quickening) between 18 and 20 weeks of the pregnancy. BODY CHANGES Your body goes through many changes during pregnancy. The changes vary from woman to woman.   Your weight will continue to increase. You will notice your lower abdomen bulging out.  You may begin to get stretch marks on your hips, abdomen, and breasts.  You may develop headaches that can be relieved by medicines approved by your caregiver.  You may urinate more often because the fetus is pressing on your bladder.  You may develop or continue to have heartburn as a result of your pregnancy.  You may develop constipation because certain hormones are causing the muscles that push waste through your intestines to slow down.  You may develop hemorrhoids or swollen, bulging veins (varicose veins).  You may have back pain because of the weight gain and pregnancy hormones relaxing your joints between the bones in your pelvis and as a result of a shift in weight and the muscles that support your balance.  Your breasts will continue to grow and be tender.  Your gums may bleed and may be sensitive to brushing and flossing.  Dark spots or blotches (chloasma, mask of pregnancy) may develop on your face. This will likely fade after the baby is born.  A dark line from your belly button to the pubic area (linea nigra) may appear. This will likely fade after the  baby is born. WHAT TO EXPECT AT YOUR PRENATAL VISITS During a routine prenatal visit:  You will be weighed to make sure you and the fetus are growing normally.  Your blood pressure will be taken.  Your abdomen will be measured to track your baby's growth.  The fetal heartbeat will be listened to.  Any test results from the previous visit will be discussed. Your caregiver may ask you:  How you are feeling.  If you are feeling the baby move.  If you have had any abnormal symptoms, such as leaking fluid, bleeding, severe headaches, or abdominal cramping.  If you have any questions. Other tests that may be performed during your second trimester include:  Blood tests that check for:  Low iron levels (anemia).  Gestational diabetes (between 24 and 28 weeks).  Rh antibodies.  Urine tests to check for infections, diabetes, or protein in the urine.  An ultrasound to confirm the proper growth and development of the baby.  An amniocentesis to check for possible genetic problems.  Fetal screens for spina bifida and Down syndrome. HOME CARE INSTRUCTIONS   Avoid all smoking, herbs, alcohol, and unprescribed drugs. These chemicals affect the formation and growth of the baby.  Follow your caregiver's instructions regarding medicine use. There are medicines that are either safe or unsafe to take during pregnancy.  Exercise only as directed by your caregiver. Experiencing uterine cramps is a good sign to stop exercising.  Continue to eat regular,   healthy meals.  Wear a good support bra for breast tenderness.  Do not use hot tubs, steam rooms, or saunas.  Wear your seat belt at all times when driving.  Avoid raw meat, uncooked cheese, cat litter boxes, and soil used by cats. These carry germs that can cause birth defects in the baby.  Take your prenatal vitamins.  Try taking a stool softener (if your caregiver approves) if you develop constipation. Eat more high-fiber foods,  such as fresh vegetables or fruit and whole grains. Drink plenty of fluids to keep your urine clear or pale yellow.  Take warm sitz baths to soothe any pain or discomfort caused by hemorrhoids. Use hemorrhoid cream if your caregiver approves.  If you develop varicose veins, wear support hose. Elevate your feet for 15 minutes, 3 4 times a day. Limit salt in your diet.  Avoid heavy lifting, wear low heel shoes, and practice good posture.  Rest with your legs elevated if you have leg cramps or low back pain.  Visit your dentist if you have not gone yet during your pregnancy. Use a soft toothbrush to brush your teeth and be gentle when you floss.  A sexual relationship may be continued unless your caregiver directs you otherwise.  Continue to go to all your prenatal visits as directed by your caregiver. SEEK MEDICAL CARE IF:   You have dizziness.  You have mild pelvic cramps, pelvic pressure, or nagging pain in the abdominal area.  You have persistent nausea, vomiting, or diarrhea.  You have a bad smelling vaginal discharge.  You have pain with urination. SEEK IMMEDIATE MEDICAL CARE IF:   You have a fever.  You are leaking fluid from your vagina.  You have spotting or bleeding from your vagina.  You have severe abdominal cramping or pain.  You have rapid weight gain or loss.  You have shortness of breath with chest pain.  You notice sudden or extreme swelling of your face, hands, ankles, feet, or legs.  You have not felt your baby move in over an hour.  You have severe headaches that do not go away with medicine.  You have vision changes. Document Released: 04/11/2001 Document Revised: 12/18/2012 Document Reviewed: 06/18/2012 [employer] Patient Information 2014 [employer].  

## 2013-06-04 NOTE — Progress Notes (Signed)
Pulse 75 C/o increasing vaginal pressure.

## 2013-06-04 NOTE — Progress Notes (Signed)
S: 28 yo [id number] here for ROBV.  Hx of PTD at [redacted]w[redacted]d with PPROM Has not been offered 17-P  -having some thin vaginal discharge and itching  No vb, lof.  No pain.    O: see flowsheet  A/P - discussed hx of PTD and 17-ohp - paper work done to start - pt declined quad screen  - f/u in 4 weeks for visit- will need to be in [facility] - [place] ordered today - wet prep obtained.

## 2013-06-05 LAB — WET PREP, GENITAL
Trich, Wet Prep: NONE SEEN
Yeast Wet Prep HPF POC: NONE SEEN

## 2013-06-09 ENCOUNTER — Other Ambulatory Visit: Payer: Self-pay | Admitting: Family Medicine

## 2013-06-09 ENCOUNTER — Telehealth: Payer: Self-pay

## 2013-06-09 MED ORDER — METRONIDAZOLE 500 MG PO TABS: 500.0000 mg | ORAL_TABLET | Freq: Two times a day (BID) | ORAL | Status: AC

## 2013-06-09 NOTE — Telephone Encounter (Signed)
Called pt. And informed her of BV results and prescription waiting at her [employer] Pt. Verbalized understanding and had no questions or concerns.

## 2013-06-09 NOTE — Telephone Encounter (Signed)
Message copied by Geanie Logan on Mon Jun 09, 2013  2:01 PM ------      Message from: Kassie Mends      Created: Mon Jun 09, 2013  9:40 AM       Please let her know that she has BV. I sent an RX to the pharmacy.  Thanks ladies!!! ------

## 2013-07-01 ENCOUNTER — Telehealth: Payer: Self-pay | Admitting: *Deleted

## 2013-07-01 ENCOUNTER — Encounter (HOSPITAL_COMMUNITY): Payer: Self-pay | Admitting: *Deleted

## 2013-07-01 ENCOUNTER — Inpatient Hospital Stay (HOSPITAL_COMMUNITY)
Admission: AD | Admit: 2013-07-01 | Discharge: 2013-07-01 | Disposition: A | Discharge status: Home / Self Care | Payer: Medicaid Other | Source: Ambulatory Visit | Attending: Obstetrics and Gynecology | Admitting: Obstetrics and Gynecology

## 2013-07-01 DIAGNOSIS — N949 Unspecified condition associated with female genital organs and menstrual cycle: Secondary | ICD-10-CM | POA: Insufficient documentation

## 2013-07-01 DIAGNOSIS — E860 Dehydration: Secondary | ICD-10-CM

## 2013-07-01 DIAGNOSIS — N760 Acute vaginitis: Secondary | ICD-10-CM

## 2013-07-01 DIAGNOSIS — B9689 Other specified bacterial agents as the cause of diseases classified elsewhere: Secondary | ICD-10-CM | POA: Insufficient documentation

## 2013-07-01 DIAGNOSIS — R109 Unspecified abdominal pain: Secondary | ICD-10-CM | POA: Insufficient documentation

## 2013-07-01 DIAGNOSIS — O26899 Other specified pregnancy related conditions, unspecified trimester: Secondary | ICD-10-CM

## 2013-07-01 DIAGNOSIS — A499 Bacterial infection, unspecified: Secondary | ICD-10-CM

## 2013-07-01 LAB — URINALYSIS, ROUTINE W REFLEX MICROSCOPIC
Bilirubin Urine: NEGATIVE
Glucose, UA: NEGATIVE mg/dL
Hgb urine dipstick: NEGATIVE
KETONES UR: 15 mg/dL — AB
LEUKOCYTES UA: NEGATIVE
NITRITE: NEGATIVE
PROTEIN: NEGATIVE mg/dL
Specific Gravity, Urine: >1.03 — ABNORMAL HIGH (ref 1.005–1.030)
Urobilinogen, UA: 0.2 mg/dL (ref 0.0–1.0)
pH: 6 (ref 5.0–8.0)

## 2013-07-01 LAB — WET PREP, GENITAL
TRICH WET PREP: NONE SEEN
YEAST WET PREP: NONE SEEN

## 2013-07-01 MED ORDER — FLUCONAZOLE 150 MG PO TABS: 150.0000 mg | ORAL_TABLET | Freq: Every day | ORAL | Status: DC

## 2013-07-01 MED ORDER — METRONIDAZOLE 500 MG PO TABS: 500.0000 mg | ORAL_TABLET | Freq: Two times a day (BID) | ORAL | Status: DC

## 2013-07-01 NOTE — Discharge Instructions (Signed)
Abdominal Pain During Pregnancy Belly (abdominal) pain is common during pregnancy. Most of the time, it is not a serious problem. Other times, it can be a sign that something is wrong with the pregnancy. Always tell your doctor if you have belly pain. HOME CARE Monitor your belly pain for any changes. The following actions may help you feel better:  Do not have sex (intercourse) or put anything in your vagina until you feel better.  Rest until your pain stops.  Drink clear fluids if you feel sick to your stomach (nauseous). Do not eat solid food until you feel better.  Only take medicine as told by your doctor.  Keep all doctor visits as told. GET HELP RIGHT AWAY IF:   You are bleeding, leaking fluid, or pieces of tissue come out of your vagina.  You have more pain or cramping.  You keep throwing up (vomiting).  You have pain when you pee (urinate) or have blood in your pee.  You have a fever.  You do not feel your baby moving as much.  You feel very weak or feel like passing out.  You have trouble breathing, with or without belly pain.  You have a very bad headache and belly pain.  You have fluid leaking from your vagina and belly pain.  You keep having watery poop (diarrhea).  Your belly pain does not go away after resting, or the pain gets worse. MAKE SURE YOU:   Understand these instructions.  Will watch your condition.  Will get help right away if you are not doing well or get worse. Document Released: 04/05/2009 Document Revised: 12/18/2012 Document Reviewed: 11/14/2012 [facility] Patient Information 2014 [place], [place]. Bacterial Vaginosis Bacterial vaginosis is an infection of the vagina. It happens when too many of certain germs (bacteria) grow in the vagina. HOME CARE  Take your medicine as told by your doctor.  Finish your medicine even if you start to feel better.  Do not have sex until you finish your medicine and are better.  Tell your sex  partner that you have an infection. They should see their doctor for treatment.  Practice safe sex. Use condoms. Have only one sex partner. GET HELP IF:  You are not getting better after 3 days of treatment.  You have more grey fluid (discharge) coming from your vagina than before.  You have more pain than before.  You have a fever. MAKE SURE YOU:   Understand these instructions.  Will watch your condition.  Will get help right away if you are not doing well or get worse. Document Released: 01/25/2008 Document Revised: 02/05/2013 Document Reviewed: 11/27/2012 [facility] Patient Information 2014 [place].

## 2013-07-01 NOTE — Telephone Encounter (Signed)
Pt called and stated she needs call back regarding medication is not working for bacterial infection and is cramping.  Called patient and pt was already in route to MAU to be seen.

## 2013-07-01 NOTE — MAU Note (Signed)
Pt complainting of discharge and pressure in the bottom of her vagina. Pt states pressure started earlier today

## 2013-07-01 NOTE — MAU Provider Note (Signed)
History     CSN: [id number]  Arrival date and time: 07/01/13 1716   First Provider Initiated Contact with Patient 07/01/13 1758      Chief Complaint  Patient presents with  . Abdominal Pain   HPI [name] is a 28 y.o. [id number] at [redacted]w[redacted]d who presents to MAU today with complaint of vaginal discharge and lower abdominal pain and pressure. The patient has a history of PTL and will be started on 17-P in the clinic. She was treated for BV ~ 1 month ago. She continues to have a small amount of thin, white discharge. She denies vaginal bleeding, N/V/D, fever or UTI symptoms. She states that the pelvic pain and pressure started today and is worse with standing and ambulation. She states last intercourse was yesterday. She reports good fetal movement.   OB History   Grav Para Term Preterm Abortions TAB SAB Ect Mult Living   4 2 1 1 1  0 1 0 0 2      Past Medical History  Diagnosis Date  . Hypertension   . Cervical cancer   . Abnormal Pap smear 2000    cervical - laser treatment  . Pregnancy induced hypertension   . Headache(784.0)   . Urinary tract infection   . Trichomonas     Past Surgical History  Procedure Laterality Date  . Laser ablation condyloma cervical / vulvar      Family History  Problem Relation Age of Onset  . Hypertension Mother   . Diabetes Mother   . Diabetes Father   . Hypertension Father   . Other Neg Hx     History  Substance Use Topics  . Smoking status: Never Smoker   . Smokeless tobacco: Never Used  . Alcohol Use: No    Allergies:  Allergies  Allergen Reactions  . Other Other (See Comments)    Pt tends to get yeast infections from antibiotics. She can take antibiotics as long as she is given fluconazole.    Prescriptions prior to admission  Medication Sig Dispense Refill  . glycopyrrolate (ROBINUL) 2 MG tablet Take 1 tablet (2 mg total) by mouth 3 (three) times daily as needed.  30 tablet  3  . ondansetron (ZOFRAN) 4 MG tablet Take 1  tablet (4 mg total) by mouth every 8 (eight) hours as needed for nausea or vomiting.  20 tablet  2  . prenatal vitamin w/FE, FA (PRENATAL 1 + 1) 27-1 MG TABS tablet Take 1 tablet by mouth daily.  90 each  5  . promethazine (PHENERGAN) 25 MG tablet Take 0.5-1 tablets (12.5-25 mg total) by mouth every 6 (six) hours as needed for nausea.  30 tablet  2    Review of Systems  Constitutional: Negative for fever and malaise/fatigue.  Gastrointestinal: Positive for abdominal pain. Negative for nausea, vomiting, diarrhea and constipation.  Genitourinary: Negative for dysuria, urgency and frequency.       Neg - vaginal bleeding + Vaginal discharge   Physical Exam   Last menstrual period 02/21/2013.  Physical Exam  Constitutional: She is oriented to person, place, and time. She appears well-developed and well-nourished. No distress.  HENT:  Head: Normocephalic and atraumatic.  Cardiovascular: Normal rate.   Respiratory: Effort normal.  Genitourinary: Uterus is enlarged (appropriate for GA). Cervix exhibits no motion tenderness, no discharge and no friability. There is bleeding (scant ) around the vagina. Vaginal discharge (scant thin, white discharge noted) found.  Neurological: She is alert and oriented to person,  place, and time.  Skin: Skin is warm and dry. No erythema.  Psychiatric: She has a normal mood and affect.  Cervix: closed, thick and posterior  Results for orders placed during the hospital encounter of 07/01/13 (from the past 24 hour(s))  URINALYSIS, ROUTINE W REFLEX MICROSCOPIC     Status: Abnormal   Collection Time    07/01/13  5:40 PM      Result Value Ref Range   Color, Urine YELLOW  YELLOW   APPearance CLOUDY (*) CLEAR   Specific Gravity, Urine >1.030 (*) 1.005 - 1.030   pH 6.0  5.0 - 8.0   Glucose, UA NEGATIVE  NEGATIVE mg/dL   Hgb urine dipstick NEGATIVE  NEGATIVE   Bilirubin Urine NEGATIVE  NEGATIVE   Ketones, ur 15 (*) NEGATIVE mg/dL   Protein, ur NEGATIVE  NEGATIVE  mg/dL   Urobilinogen, UA 0.2  0.0 - 1.0 mg/dL   Nitrite NEGATIVE  NEGATIVE   Leukocytes, UA NEGATIVE  NEGATIVE  WET PREP, GENITAL     Status: Abnormal   Collection Time    07/01/13  6:12 PM      Result Value Ref Range   Yeast Wet Prep HPF POC NONE SEEN  NONE SEEN   Trich, Wet Prep NONE SEEN  NONE SEEN   Clue Cells Wet Prep HPF POC MODERATE (*) NONE SEEN   WBC, Wet Prep HPF POC MODERATE (*) NONE SEEN    MAU Course  Procedures None  MDM FHR - 155 bpm with doppler UA, wet prep and GC/Chalmaydia today  Assessment and Plan  A: Bacterial vaginosis Round ligament pain  P: Discharge home Rx for Flagyl and Diflucan sent to patient's pharmacy Recommended maternity belt, warm bath PRN for pain Pelvic rest advised until next visit in the [facility] clinic Patient advised to follow-up with [facility] clinic as scheduled for routine prenatal care Patient may return to MAU as needed or if her condition were to change or worsen  Farris Has, PA-C  07/01/2013, 6:40 PM

## 2013-07-01 NOTE — Progress Notes (Signed)
Pt states she has cramps that  She rates a 6-7 when she is sitting down but cramps increase when she is standing

## 2013-07-02 ENCOUNTER — Telehealth: Payer: Self-pay | Admitting: *Deleted

## 2013-07-02 LAB — GC/CHLAMYDIA PROBE AMP
CT Probe RNA: NEGATIVE
GC Probe RNA: NEGATIVE

## 2013-07-02 NOTE — Telephone Encounter (Signed)
Pt left message last week stating that she needed to make appt for Pap smear test. I called pt after reviewing her chart and discussed her request. She stated that she had gone to MAU yesterday for evaluation because of vaginal d/c. She was told that she had BV and received Rx which she has started taking. Since pt had originally requested Pap exam, I explained the nature of this test and that it is not performed for the sx which she was having.  I reminded pt of her appt in clinic tomorrow @ 1120. She voiced understanding of all information and had no questions.

## 2013-07-02 NOTE — MAU Provider Note (Signed)
Attestation of Attending Supervision of Advanced Practitioner (CNM/NP): Evaluation and management procedures were performed by the Advanced Practitioner under my supervision and collaboration.  I have reviewed the Advanced Practitioner's note and chart, and I agree with the management and plan.  Jessey Stehlin 07/02/2013 8:13 AM

## 2013-07-03 ENCOUNTER — Encounter: Payer: Self-pay | Admitting: Family

## 2013-07-03 ENCOUNTER — Ambulatory Visit (INDEPENDENT_AMBULATORY_CARE_PROVIDER_SITE_OTHER): Payer: Medicaid Other | Admitting: Family

## 2013-07-03 VITALS — BP 113/70 | Temp 97.7°F | Wt 225.5 lb

## 2013-07-03 DIAGNOSIS — O09219 Supervision of pregnancy with history of pre-term labor, unspecified trimester: Secondary | ICD-10-CM

## 2013-07-03 DIAGNOSIS — O09899 Supervision of other high risk pregnancies, unspecified trimester: Secondary | ICD-10-CM

## 2013-07-03 LAB — POCT URINALYSIS DIP (DEVICE)
Bilirubin Urine: NEGATIVE
Glucose, UA: NEGATIVE mg/dL
Hgb urine dipstick: NEGATIVE
Ketones, ur: NEGATIVE mg/dL
Nitrite: NEGATIVE
Protein, ur: NEGATIVE mg/dL
SPECIFIC GRAVITY, URINE: 1.02 (ref 1.005–1.030)
UROBILINOGEN UA: 0.2 mg/dL (ref 0.0–1.0)
pH: 6 (ref 5.0–8.0)

## 2013-07-03 MED ORDER — HYDROXYPROGESTERONE CAPROATE 250 MG/ML IM OIL
250.0000 mg | TOPICAL_OIL | Freq: Once | INTRAMUSCULAR | Status: AC
  Administered 2013-07-03: 250 mg via INTRAMUSCULAR

## 2013-07-03 NOTE — Progress Notes (Signed)
P=76 Pt has anatomy scan scheduled for tomorrow.

## 2013-07-03 NOTE — Progress Notes (Signed)
No problems or concerns.  Declines genetic screening.  Anatomy ultrasound tomorrow.  17-p today.

## 2013-07-03 NOTE — Addendum Note (Signed)
Addended by: Geanie Logan on: 07/03/2013 12:20 PM   Modules accepted: Orders

## 2013-07-04 ENCOUNTER — Ambulatory Visit (HOSPITAL_COMMUNITY)
Admission: RE | Admit: 2013-07-04 | Discharge: 2013-07-04 | Disposition: A | Discharge status: Home / Self Care | Payer: Medicaid Other | Source: Ambulatory Visit | Attending: Family Medicine | Admitting: Family Medicine

## 2013-07-04 DIAGNOSIS — Z3689 Encounter for other specified antenatal screening: Secondary | ICD-10-CM | POA: Insufficient documentation

## 2013-07-04 DIAGNOSIS — O99321 Drug use complicating pregnancy, first trimester: Secondary | ICD-10-CM

## 2013-07-04 DIAGNOSIS — O139 Gestational [pregnancy-induced] hypertension without significant proteinuria, unspecified trimester: Secondary | ICD-10-CM | POA: Insufficient documentation

## 2013-07-04 DIAGNOSIS — O09899 Supervision of other high risk pregnancies, unspecified trimester: Secondary | ICD-10-CM

## 2013-07-04 DIAGNOSIS — Z3480 Encounter for supervision of other normal pregnancy, unspecified trimester: Secondary | ICD-10-CM

## 2013-07-04 DIAGNOSIS — O09219 Supervision of pregnancy with history of pre-term labor, unspecified trimester: Secondary | ICD-10-CM

## 2013-07-07 ENCOUNTER — Encounter: Payer: Self-pay | Admitting: Family Medicine

## 2013-07-09 ENCOUNTER — Encounter (HOSPITAL_COMMUNITY): Payer: Self-pay | Admitting: General Practice

## 2013-07-09 ENCOUNTER — Inpatient Hospital Stay (HOSPITAL_COMMUNITY)
Admission: AD | Admit: 2013-07-09 | Discharge: 2013-07-09 | Disposition: A | Discharge status: Home / Self Care | Payer: Medicaid Other | Source: Ambulatory Visit | Attending: Obstetrics and Gynecology | Admitting: Obstetrics and Gynecology

## 2013-07-09 ENCOUNTER — Inpatient Hospital Stay (HOSPITAL_COMMUNITY): Payer: Medicaid Other

## 2013-07-09 DIAGNOSIS — O26859 Spotting complicating pregnancy, unspecified trimester: Secondary | ICD-10-CM

## 2013-07-09 DIAGNOSIS — O26851 Spotting complicating pregnancy, first trimester: Secondary | ICD-10-CM

## 2013-07-09 DIAGNOSIS — R109 Unspecified abdominal pain: Secondary | ICD-10-CM

## 2013-07-09 DIAGNOSIS — I10 Essential (primary) hypertension: Secondary | ICD-10-CM

## 2013-07-09 DIAGNOSIS — O09219 Supervision of pregnancy with history of pre-term labor, unspecified trimester: Secondary | ICD-10-CM

## 2013-07-09 DIAGNOSIS — O09899 Supervision of other high risk pregnancies, unspecified trimester: Secondary | ICD-10-CM

## 2013-07-09 DIAGNOSIS — O26899 Other specified pregnancy related conditions, unspecified trimester: Secondary | ICD-10-CM

## 2013-07-09 LAB — URINALYSIS, ROUTINE W REFLEX MICROSCOPIC
Bilirubin Urine: NEGATIVE
GLUCOSE, UA: NEGATIVE mg/dL
Hgb urine dipstick: NEGATIVE
KETONES UR: NEGATIVE mg/dL
LEUKOCYTES UA: NEGATIVE
Nitrite: NEGATIVE
PROTEIN: NEGATIVE mg/dL
Specific Gravity, Urine: 1.01 (ref 1.005–1.030)
Urobilinogen, UA: 0.2 mg/dL (ref 0.0–1.0)
pH: 6.5 (ref 5.0–8.0)

## 2013-07-09 MED ORDER — IBUPROFEN 600 MG PO TABS: 600.0000 mg | ORAL_TABLET | Freq: Four times a day (QID) | ORAL | Status: DC | PRN

## 2013-07-09 NOTE — MAU Note (Signed)
Pt reports earlier today she had some cramping, tonight at [facility] she went to the restroom and when she wiped she was bleeding.

## 2013-07-09 NOTE — MAU Provider Note (Signed)
Chief Complaint: Vaginal Bleeding and Abdominal Pain   First Provider Initiated Contact with Patient 07/09/13 2110     SUBJECTIVE HPI: Amy Cordova is a 28 y.o. [id number] at [redacted]w[redacted]d by LMP who presents with low abdominal cramping and seeing pink when wiping this evening. Ultrasound last week showed anterior placenta above the os. Cervical length 3.9 cm. Patient has history of spontaneous onset of labor and delivery at [redacted] weeks gestation. On 17-P this pregnancy. Also has history of LEEP.  Denies fever, chills, urinary complaints, GI complaints or vaginal discharge. Wet prep, gonorrhea and Chlamydia cultures negative last week. Still taking course of Flagyl for BV. Declines repeat. Last intercourse approximately 2 days ago.  Past Medical History  Diagnosis Date  . Hypertension   . Cervical cancer   . Abnormal Pap smear 2000    cervical - laser treatment  . Pregnancy induced hypertension   . Headache(784.0)   . Urinary tract infection   . Trichomonas    OB History  Gravida Para Term Preterm AB SAB TAB Ectopic Multiple Living  4 2 1 1 1 1  0 0 0 2    # Outcome Date GA Lbr Len/2nd Weight Sex Delivery Anes PTL Lv  4 CUR           3 PRE 06/09/06 [redacted]w[redacted]d  1.417 kg (3 lb 2 oz) F SVD None Y Y     Comments: hx of LEEP  2 SAB 2005          1 TRM 04/17/03 [redacted]w[redacted]d  3.26 kg (7 lb 3 oz) F SVD None N Y     Comments: Preeclampsia     Past Surgical History  Procedure Laterality Date  . Laser ablation condyloma cervical / vulvar     History   Social History  . Marital Status: Single    Spouse Name: N/A    Number of Children: N/A  . Years of Education: N/A   Occupational History  . Not on file.   Social History Main Topics  . Smoking status: Never Smoker   . Smokeless tobacco: Never Used  . Alcohol Use: No  . Drug Use: No  . Sexual Activity: Yes   Other Topics Concern  . Not on file   Social History Narrative  . No narrative on file   No current facility-administered medications on  file prior to encounter.   Current Outpatient Prescriptions on File Prior to Encounter  Medication Sig Dispense Refill  . metroNIDAZOLE (FLAGYL) 500 MG tablet Take 1 tablet (500 mg total) by mouth 2 (two) times daily.  14 tablet  0  . fluconazole (DIFLUCAN) 150 MG tablet Take 1 tablet (150 mg total) by mouth daily.  1 tablet  0   Allergies  Allergen Reactions  . Other Other (See Comments)    Pt tends to get yeast infections from antibiotics. She can take antibiotics as long as she is given fluconazole.    ROS: Pertinent items in HPI  OBJECTIVE Blood pressure 111/87, pulse 66, temperature 98.5 F (36.9 C), temperature source Oral, resp. rate 20, height 5\' 6"  (1.676 m), weight 101.606 kg (224 lb), last menstrual period 02/21/2013, SpO2 100.00%. GENERAL: Well-developed, well-nourished female in no acute distress.  HEENT: Normocephalic HEART: normal rate RESP: normal effort ABDOMEN: Soft, non-tender EXTREMITIES: Nontender, no edema NEURO: Alert and oriented SPECULUM EXAM: NEFG, moderate amount of creamy, faintly pink, malodorous discharge, cervix clean, nonfriable. BIMANUAL: cervix long and closed; uterus 20-week size, no adnexal tenderness or masses Fetal  heart rate 153 by Doppler.  LAB RESULTS Results for orders placed during the hospital encounter of 07/09/13 (from the past 168 hour(s))  URINALYSIS, ROUTINE W REFLEX MICROSCOPIC   Collection Time    07/09/13  8:26 PM      Result Value Ref Range   Color, Urine YELLOW  YELLOW   APPearance CLEAR  CLEAR   Specific Gravity, Urine 1.010  1.005 - 1.030   pH 6.5  5.0 - 8.0   Glucose, UA NEGATIVE  NEGATIVE mg/dL   Hgb urine dipstick NEGATIVE  NEGATIVE   Bilirubin Urine NEGATIVE  NEGATIVE   Ketones, ur NEGATIVE  NEGATIVE mg/dL   Protein, ur NEGATIVE  NEGATIVE mg/dL   Urobilinogen, UA 0.2  0.0 - 1.0 mg/dL   Nitrite NEGATIVE  NEGATIVE   Leukocytes, UA NEGATIVE  NEGATIVE    IMAGING Cervical length 3.6 cm. No funneling. No evidence  of abruption or previa.  MAU COURSE  ASSESSMENT 1. Hx of preterm delivery, currently pregnant   2. Spotting complicating pregnancy in first trimester, antepartum   3. Cramping complicating pregnancy, antepartum    PLAN Discharge home in stable condition. Pelvic rest x1 week. Complete course of Flagyl as directed. Bleeding precautions. Increase fluids and rest. Follow-up Information   Follow up with [facility] On 07/10/2013.   Specialty:  Obstetrics and Gynecology   Contact information:   [address] [place] [address] [phone]      Follow up with [facility]. (As needed If symptoms worsen)    Contact information:   [address] [id number] [place] [address] [phone]       Medication List    ASK your doctor about these medications       fluconazole 150 MG tablet  Commonly known as:  DIFLUCAN  Take 1 tablet (150 mg total) by mouth daily.     metroNIDAZOLE 500 MG tablet  Commonly known as:  FLAGYL  Take 1 tablet (500 mg total) by mouth 2 (two) times daily.     prenatal multivitamin Tabs tablet  Take 1 tablet by mouth at bedtime.       [place], [place] 07/09/2013  9:06 PM

## 2013-07-09 NOTE — Discharge Instructions (Signed)
Vaginal Bleeding During Pregnancy, Second Trimester °A small amount of bleeding (spotting) from the vagina is relatively common in pregnancy. It usually stops on its own. Various things can cause bleeding or spotting in pregnancy. Some bleeding may be related to the pregnancy, and some may not. Sometimes the bleeding is normal and is not a problem. However, bleeding can also be a sign of something serious. Be sure to tell your health care provider about any vaginal bleeding right away. °Some possible causes of vaginal bleeding during the second trimester include: °· Infection, inflammation, or growths on the cervix.   °· The placenta may be partially or completely covering the opening of the cervix inside the uterus (placenta previa). °· The placenta may have separated from the uterus (abruption of the placenta).   °· You may be having early (preterm) labor.   °· The cervix may not be strong enough to keep a baby inside the uterus (cervical insufficiency).   °· Tiny cysts may have developed in the uterus instead of pregnancy tissue (molar pregnancy).  °HOME CARE INSTRUCTIONS  °Watch your condition for any changes. The following actions may help to lessen any discomfort you are feeling: °· Follow your health care provider's instructions for limiting your activity. If your health care provider orders bed rest, you may need to stay in bed and only get up to use the bathroom. However, your health care provider may allow you to continue light activity. °· If needed, make plans for someone to help with your regular activities and responsibilities while you are on bed rest. °· Keep track of the number of pads you use each day, how often you change pads, and how soaked (saturated) they are. Write this down. °· Do not use tampons. Do not douche. °· Do not have sexual intercourse or orgasms until approved by your health care provider. °· If you pass any tissue from your vagina, save the tissue so you can show it to your  health care provider. °· Only take over-the-counter or prescription medicines as directed by your health care provider. °· Do not take aspirin because it can make you bleed. °· Do not exercise or perform any strenuous activities or heavy lifting without your health care provider's permission. °· Keep all follow-up appointments as directed by your health care provider. °SEEK MEDICAL CARE IF: °· You have any vaginal bleeding during any part of your pregnancy. °· You have cramps or labor pains. °SEEK IMMEDIATE MEDICAL CARE IF:  °· You have severe cramps in your back or belly (abdomen). °· You have contractions. °· You have a fever, not controlled by medicine. °· You have chills. °· You pass large clots or tissue from your vagina. °· Your bleeding increases. °· You feel lightheaded or weak, or you have fainting episodes. °· You are leaking fluid or have a gush of fluid from your vagina. °MAKE SURE YOU: °· Understand these instructions. °· Will watch your condition. °· Will get help right away if you are not doing well or get worse. °Document Released: 01/25/2005 Document Revised: 02/05/2013 Document Reviewed: 12/23/2012 °[employer]® Patient Information ©2014 [employer]. ° °

## 2013-07-10 ENCOUNTER — Ambulatory Visit (INDEPENDENT_AMBULATORY_CARE_PROVIDER_SITE_OTHER): Payer: Medicaid Other

## 2013-07-10 VITALS — BP 104/70 | HR 69 | Temp 97.3°F | Wt 223.9 lb

## 2013-07-10 DIAGNOSIS — O09899 Supervision of other high risk pregnancies, unspecified trimester: Secondary | ICD-10-CM

## 2013-07-10 DIAGNOSIS — N760 Acute vaginitis: Secondary | ICD-10-CM

## 2013-07-10 DIAGNOSIS — B9689 Other specified bacterial agents as the cause of diseases classified elsewhere: Secondary | ICD-10-CM

## 2013-07-10 DIAGNOSIS — A499 Bacterial infection, unspecified: Secondary | ICD-10-CM

## 2013-07-10 DIAGNOSIS — O09219 Supervision of pregnancy with history of pre-term labor, unspecified trimester: Secondary | ICD-10-CM

## 2013-07-10 MED ORDER — METRONIDAZOLE 500 MG PO TABS: 500.0000 mg | ORAL_TABLET | Freq: Two times a day (BID) | ORAL | Status: DC

## 2013-07-10 MED ORDER — HYDROXYPROGESTERONE CAPROATE 250 MG/ML IM OIL
250.0000 mg | TOPICAL_OIL | Freq: Once | INTRAMUSCULAR | Status: AC
  Administered 2013-07-10: 250 mg via INTRAMUSCULAR

## 2013-07-10 NOTE — Progress Notes (Signed)
Pt. Here today for 17P. States she has been moving and misplaced the last 4 pills of flagyl for treatment of BV. Consulted Dr. Roselie Awkward who OK'd re-prescribing medication. Medication sent to pt. Pharmacy. Pt. Informed. Deniesa ny other questions, concerns or problems.

## 2013-07-11 NOTE — MAU Provider Note (Signed)
`````  Attestation of Attending Supervision of Advanced Practitioner: Evaluation and management procedures were performed by the PA/NP/CNM/OB Fellow under my supervision/collaboration. Chart reviewed and agree with management and plan.  Jonnie Kind 07/11/2013 6:47 PM

## 2013-07-17 ENCOUNTER — Ambulatory Visit (INDEPENDENT_AMBULATORY_CARE_PROVIDER_SITE_OTHER): Payer: Medicaid Other | Admitting: *Deleted

## 2013-07-17 VITALS — BP 107/72 | HR 73 | Temp 98.2°F | Wt 222.9 lb

## 2013-07-17 DIAGNOSIS — O09219 Supervision of pregnancy with history of pre-term labor, unspecified trimester: Secondary | ICD-10-CM

## 2013-07-17 DIAGNOSIS — O09899 Supervision of other high risk pregnancies, unspecified trimester: Secondary | ICD-10-CM

## 2013-07-17 MED ORDER — HYDROXYPROGESTERONE CAPROATE 250 MG/ML IM OIL
250.0000 mg | TOPICAL_OIL | Freq: Once | INTRAMUSCULAR | Status: AC
  Administered 2013-07-17: 250 mg via INTRAMUSCULAR

## 2013-07-24 ENCOUNTER — Encounter: Payer: Self-pay | Admitting: Family Medicine

## 2013-07-24 ENCOUNTER — Ambulatory Visit (INDEPENDENT_AMBULATORY_CARE_PROVIDER_SITE_OTHER): Payer: Medicaid Other | Admitting: Family Medicine

## 2013-07-24 ENCOUNTER — Telehealth: Payer: Self-pay | Admitting: *Deleted

## 2013-07-24 VITALS — BP 126/74 | Temp 96.8°F | Wt 223.0 lb

## 2013-07-24 DIAGNOSIS — O0990 Supervision of high risk pregnancy, unspecified, unspecified trimester: Secondary | ICD-10-CM

## 2013-07-24 DIAGNOSIS — O09219 Supervision of pregnancy with history of pre-term labor, unspecified trimester: Secondary | ICD-10-CM

## 2013-07-24 DIAGNOSIS — O09899 Supervision of other high risk pregnancies, unspecified trimester: Secondary | ICD-10-CM

## 2013-07-24 DIAGNOSIS — Z3490 Encounter for supervision of normal pregnancy, unspecified, unspecified trimester: Secondary | ICD-10-CM

## 2013-07-24 MED ORDER — HYDROXYPROGESTERONE CAPROATE 250 MG/ML IM OIL
250.0000 mg | TOPICAL_OIL | INTRAMUSCULAR | Status: AC
  Administered 2013-07-24 – 2013-10-30 (×15): 250 mg via INTRAMUSCULAR

## 2013-07-24 NOTE — Telephone Encounter (Signed)
Called patient to give her ultrasound appt time. We weren't able to schedule while she was here this morning. Scheduled for 08/01/13 at [place] patient to inform her and a female answered and said her would get her to the phone. Phone was then disconnected. Tried again with no answer.

## 2013-07-24 NOTE — Addendum Note (Signed)
Addended by: Christiana Pellant A on: 07/24/2013 12:36 PM   Modules accepted: Orders

## 2013-07-24 NOTE — Patient Instructions (Signed)
Second Trimester of Pregnancy The second trimester is from week 13 through week 28, months 4 through 6. The second trimester is often a time when you feel your best. Your body has also adjusted to being pregnant, and you begin to feel better physically. Usually, morning sickness has lessened or quit completely, you may have more energy, and you may have an increase in appetite. The second trimester is also a time when the fetus is growing rapidly. At the end of the sixth month, the fetus is about 9 inches long and weighs about 1 pounds. You will likely begin to feel the baby move (quickening) between 18 and 20 weeks of the pregnancy. BODY CHANGES Your body goes through many changes during pregnancy. The changes vary from woman to woman.   Your weight will continue to increase. You will notice your lower abdomen bulging out.  You may begin to get stretch marks on your hips, abdomen, and breasts.  You may develop headaches that can be relieved by medicines approved by your caregiver.  You may urinate more often because the fetus is pressing on your bladder.  You may develop or continue to have heartburn as a result of your pregnancy.  You may develop constipation because certain hormones are causing the muscles that push waste through your intestines to slow down.  You may develop hemorrhoids or swollen, bulging veins (varicose veins).  You may have back pain because of the weight gain and pregnancy hormones relaxing your joints between the bones in your pelvis and as a result of a shift in weight and the muscles that support your balance.  Your breasts will continue to grow and be tender.  Your gums may bleed and may be sensitive to brushing and flossing.  Dark spots or blotches (chloasma, mask of pregnancy) may develop on your face. This will likely fade after the baby is born.  A dark line from your belly button to the pubic area (linea nigra) may appear. This will likely fade after the  baby is born. WHAT TO EXPECT AT YOUR PRENATAL VISITS During a routine prenatal visit:  You will be weighed to make sure you and the fetus are growing normally.  Your blood pressure will be taken.  Your abdomen will be measured to track your baby's growth.  The fetal heartbeat will be listened to.  Any test results from the previous visit will be discussed. Your caregiver may ask you:  How you are feeling.  If you are feeling the baby move.  If you have had any abnormal symptoms, such as leaking fluid, bleeding, severe headaches, or abdominal cramping.  If you have any questions. Other tests that may be performed during your second trimester include:  Blood tests that check for:  Low iron levels (anemia).  Gestational diabetes (between 24 and 28 weeks).  Rh antibodies.  Urine tests to check for infections, diabetes, or protein in the urine.  An ultrasound to confirm the proper growth and development of the baby.  An amniocentesis to check for possible genetic problems.  Fetal screens for spina bifida and Down syndrome. HOME CARE INSTRUCTIONS   Avoid all smoking, herbs, alcohol, and unprescribed drugs. These chemicals affect the formation and growth of the baby.  Follow your caregiver's instructions regarding medicine use. There are medicines that are either safe or unsafe to take during pregnancy.  Exercise only as directed by your caregiver. Experiencing uterine cramps is a good sign to stop exercising.  Continue to eat regular,   healthy meals.  Wear a good support bra for breast tenderness.  Do not use hot tubs, steam rooms, or saunas.  Wear your seat belt at all times when driving.  Avoid raw meat, uncooked cheese, cat litter boxes, and soil used by cats. These carry germs that can cause birth defects in the baby.  Take your prenatal vitamins.  Try taking a stool softener (if your caregiver approves) if you develop constipation. Eat more high-fiber foods,  such as fresh vegetables or fruit and whole grains. Drink plenty of fluids to keep your urine clear or pale yellow.  Take warm sitz baths to soothe any pain or discomfort caused by hemorrhoids. Use hemorrhoid cream if your caregiver approves.  If you develop varicose veins, wear support hose. Elevate your feet for 15 minutes, 3 4 times a day. Limit salt in your diet.  Avoid heavy lifting, wear low heel shoes, and practice good posture.  Rest with your legs elevated if you have leg cramps or low back pain.  Visit your dentist if you have not gone yet during your pregnancy. Use a soft toothbrush to brush your teeth and be gentle when you floss.  A sexual relationship may be continued unless your caregiver directs you otherwise.  Continue to go to all your prenatal visits as directed by your caregiver. SEEK MEDICAL CARE IF:   You have dizziness.  You have mild pelvic cramps, pelvic pressure, or nagging pain in the abdominal area.  You have persistent nausea, vomiting, or diarrhea.  You have a bad smelling vaginal discharge.  You have pain with urination. SEEK IMMEDIATE MEDICAL CARE IF:   You have a fever.  You are leaking fluid from your vagina.  You have spotting or bleeding from your vagina.  You have severe abdominal cramping or pain.  You have rapid weight gain or loss.  You have shortness of breath with chest pain.  You notice sudden or extreme swelling of your face, hands, ankles, feet, or legs.  You have not felt your baby move in over an hour.  You have severe headaches that do not go away with medicine.  You have vision changes. Document Released: 04/11/2001 Document Revised: 12/18/2012 Document Reviewed: 06/18/2012 [employer] Patient Information 2014 [employer].  

## 2013-07-24 NOTE — Progress Notes (Signed)
Pulse- 80 Patient reports that she's still having the d/c with odor and that the flagyl didn't really help

## 2013-07-24 NOTE — Progress Notes (Signed)
+  FM, no lof, no recurrent VB, no ctx  On 17p continue  reeval face on [place] - ordered today  Tilly Pernice is a 29 y.o. [id number] at [redacted]w[redacted]d by L=7 presents for ROB  Discussed with Patient:  - Patient plans on breast/ bottle feeding. - Routine precautions discussed (depression, infection s/s).   Patient provided with all pertinent phone numbers for emergencies. - RTC for any VB, regular, painful cramps/ctxs occurring at a rate of >2/10 min, fever (100.5 or higher), n/v/d, any pain that is unresolving or worsening. - RTC in 4 weeks for next appt.  Problems: Patient Active Problem List   Diagnosis Date Noted  . Hx of preterm delivery, currently pregnant 06/04/2013  . 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

## 2013-07-27 ENCOUNTER — Inpatient Hospital Stay (HOSPITAL_COMMUNITY)
Admission: AD | Admit: 2013-07-27 | Discharge: 2013-07-27 | Disposition: A | Discharge status: Home / Self Care | Payer: Medicaid Other | Source: Ambulatory Visit | Attending: Obstetrics & Gynecology | Admitting: Obstetrics & Gynecology

## 2013-07-27 ENCOUNTER — Encounter (HOSPITAL_COMMUNITY): Payer: Self-pay | Admitting: *Deleted

## 2013-07-27 DIAGNOSIS — A499 Bacterial infection, unspecified: Secondary | ICD-10-CM | POA: Insufficient documentation

## 2013-07-27 DIAGNOSIS — O2390 Unspecified genitourinary tract infection in pregnancy, unspecified trimester: Secondary | ICD-10-CM | POA: Insufficient documentation

## 2013-07-27 DIAGNOSIS — O26859 Spotting complicating pregnancy, unspecified trimester: Secondary | ICD-10-CM | POA: Insufficient documentation

## 2013-07-27 DIAGNOSIS — B9689 Other specified bacterial agents as the cause of diseases classified elsewhere: Secondary | ICD-10-CM | POA: Insufficient documentation

## 2013-07-27 DIAGNOSIS — R109 Unspecified abdominal pain: Secondary | ICD-10-CM | POA: Insufficient documentation

## 2013-07-27 DIAGNOSIS — N760 Acute vaginitis: Secondary | ICD-10-CM | POA: Insufficient documentation

## 2013-07-27 LAB — WET PREP, GENITAL
Trich, Wet Prep: NONE SEEN
Yeast Wet Prep HPF POC: NONE SEEN

## 2013-07-27 MED ORDER — FLUCONAZOLE 200 MG PO TABS: 200.0000 mg | ORAL_TABLET | Freq: Every day | ORAL | Status: DC

## 2013-07-27 MED ORDER — METRONIDAZOLE 500 MG PO TABS: 500.0000 mg | ORAL_TABLET | Freq: Two times a day (BID) | ORAL | Status: DC

## 2013-07-27 NOTE — MAU Provider Note (Signed)
History   [id number] at 22 wks in with c/o cramping for several days and some pinkish brown spotting. Has hx of LEEP and is on 17 p.  CSN: [id number]  Arrival date and time: 07/27/13 1454   First Provider Initiated Contact with Patient 07/27/13 1534      No chief complaint on file.  HPI  OB History   Grav Para Term Preterm Abortions TAB SAB Ect Mult Living   4 2 1 1 1  0 1 0 0 2      Past Medical History  Diagnosis Date  . Hypertension   . Cervical cancer   . Abnormal Pap smear 2000    cervical - laser treatment  . Pregnancy induced hypertension   . Headache(784.0)   . Urinary tract infection   . Trichomonas     Past Surgical History  Procedure Laterality Date  . Laser ablation condyloma cervical / vulvar      Family History  Problem Relation Age of Onset  . Hypertension Mother   . Diabetes Mother   . Diabetes Father   . Hypertension Father   . Other Neg Hx     History  Substance Use Topics  . Smoking status: Never Smoker   . Smokeless tobacco: Never Used  . Alcohol Use: No    Allergies:  Allergies  Allergen Reactions  . Other Other (See Comments)    Pt tends to get yeast infections from antibiotics. She can take antibiotics as long as she is given fluconazole.    Facility-administered medications prior to admission  Medication Dose Route Frequency Provider Last Rate Last Dose  . hydroxyprogesterone caproate (DELALUTIN) 250 mg/mL injection 250 mg  250 mg Intramuscular Weekly Allen Norris, MD   250 mg at 07/24/13 1235   Prescriptions prior to admission  Medication Sig Dispense Refill  . ondansetron (ZOFRAN) 4 MG tablet Take 4 mg by mouth every 8 (eight) hours as needed for nausea or vomiting.      . Prenatal Vit-Fe Fumarate-FA (PRENATAL MULTIVITAMIN) TABS tablet Take 1 tablet by mouth at bedtime.        Review of Systems  Constitutional: Negative.   HENT: Negative.   Eyes: Negative.   Respiratory: Negative.   Cardiovascular: Negative.    Gastrointestinal: Positive for abdominal pain.  Genitourinary: Negative.   Musculoskeletal: Negative.   Skin: Negative.   Neurological: Negative.   Endo/Heme/Allergies: Negative.   Psychiatric/Behavioral: Negative.    Physical Exam   Blood pressure 119/64, pulse 79, temperature 98.1 F (36.7 C), temperature source Oral, resp. rate 16, height 5\' 6"  (1.676 m), weight 223 lb (101.152 kg), last menstrual period 02/21/2013.  Physical Exam  Constitutional: She is oriented to person, place, and time. She appears well-developed and well-nourished.  HENT:  Head: Normocephalic.  Eyes: Pupils are equal, round, and reactive to light.  Neck: Normal range of motion.  Cardiovascular: Normal rate, regular rhythm, normal heart sounds and intact distal pulses.   Respiratory: Effort normal and breath sounds normal.  GI: Soft. Bowel sounds are normal.  Genitourinary: Vagina normal and uterus normal.  Musculoskeletal: Normal range of motion.  Neurological: She is alert and oriented to person, place, and time. She has normal reflexes.  Skin: Skin is warm and dry.  Psychiatric: She has a normal mood and affect. Her behavior is normal. Judgment and thought content normal.    MAU Course  Procedures  MDM BV  Assessment and Plan  Sterile spec exam done, cervix visually closed and thick.  No active bleeding noted. Pt does have yellowish discharge with ammine odor. Wet prep, GC, and chla obtained. SVE done cervix is closed/th/post/firm/ and high. Wet prep shows BV.  Amy Cordova DARLENE 07/27/2013, 3:38 PM

## 2013-07-27 NOTE — MAU Note (Signed)
Pt c/o burgundy/brown, vaginal bleeding, had sex one week ago, clear discharge with odor and lower abdominal cramping; the bleeding and cramping started last night.   Pt thought it was because she was on her feet all day yesterday.

## 2013-07-28 LAB — GC/CHLAMYDIA PROBE AMP
CT Probe RNA: NEGATIVE
GC PROBE AMP APTIMA: NEGATIVE

## 2013-07-28 NOTE — Telephone Encounter (Signed)
Note made to inform patient while she is here for 17P

## 2013-07-31 ENCOUNTER — Ambulatory Visit (INDEPENDENT_AMBULATORY_CARE_PROVIDER_SITE_OTHER): Payer: Medicaid Other | Admitting: General Practice

## 2013-07-31 VITALS — BP 113/71 | HR 71 | Temp 97.1°F | Ht 66.0 in | Wt 224.8 lb

## 2013-07-31 DIAGNOSIS — O09899 Supervision of other high risk pregnancies, unspecified trimester: Secondary | ICD-10-CM

## 2013-07-31 DIAGNOSIS — O09219 Supervision of pregnancy with history of pre-term labor, unspecified trimester: Secondary | ICD-10-CM

## 2013-08-01 ENCOUNTER — Ambulatory Visit (HOSPITAL_COMMUNITY)
Admission: RE | Admit: 2013-08-01 | Discharge: 2013-08-01 | Disposition: A | Discharge status: Home / Self Care | Payer: Medicaid Other | Source: Ambulatory Visit | Attending: Family Medicine | Admitting: Family Medicine

## 2013-08-01 ENCOUNTER — Other Ambulatory Visit: Payer: Self-pay | Admitting: Family Medicine

## 2013-08-01 DIAGNOSIS — O139 Gestational [pregnancy-induced] hypertension without significant proteinuria, unspecified trimester: Secondary | ICD-10-CM | POA: Insufficient documentation

## 2013-08-01 DIAGNOSIS — O0990 Supervision of high risk pregnancy, unspecified, unspecified trimester: Secondary | ICD-10-CM

## 2013-08-01 DIAGNOSIS — Z3689 Encounter for other specified antenatal screening: Secondary | ICD-10-CM | POA: Insufficient documentation

## 2013-08-01 DIAGNOSIS — Z8751 Personal history of pre-term labor: Secondary | ICD-10-CM | POA: Insufficient documentation

## 2013-08-04 ENCOUNTER — Encounter: Payer: Self-pay | Admitting: Family Medicine

## 2013-08-04 DIAGNOSIS — O09899 Supervision of other high risk pregnancies, unspecified trimester: Secondary | ICD-10-CM

## 2013-08-04 DIAGNOSIS — O09219 Supervision of pregnancy with history of pre-term labor, unspecified trimester: Principal | ICD-10-CM

## 2013-08-04 NOTE — Progress Notes (Signed)
Patient ID: Amy Cordova, female   DOB: 08-16-1985, 28 y.o.   MRN: [id number] Reviewed [place] - recs for q4 growth scans in setting of GHTN. Miscommunication with MFM. No evidence of maternal HTN. Appreciate recs, and will not follow in setting of normotensive patient. Will continue to follow and if changes will reevaluate

## 2013-08-07 ENCOUNTER — Ambulatory Visit (INDEPENDENT_AMBULATORY_CARE_PROVIDER_SITE_OTHER): Payer: Medicaid Other | Admitting: General Practice

## 2013-08-07 VITALS — BP 104/71 | HR 71 | Temp 97.8°F | Ht 66.0 in | Wt 227.4 lb

## 2013-08-07 DIAGNOSIS — O09219 Supervision of pregnancy with history of pre-term labor, unspecified trimester: Secondary | ICD-10-CM

## 2013-08-07 DIAGNOSIS — O09899 Supervision of other high risk pregnancies, unspecified trimester: Secondary | ICD-10-CM

## 2013-08-14 ENCOUNTER — Ambulatory Visit (INDEPENDENT_AMBULATORY_CARE_PROVIDER_SITE_OTHER): Payer: Medicaid Other

## 2013-08-14 VITALS — BP 107/77 | HR 75 | Temp 97.4°F | Wt 229.2 lb

## 2013-08-14 DIAGNOSIS — O09219 Supervision of pregnancy with history of pre-term labor, unspecified trimester: Secondary | ICD-10-CM

## 2013-08-14 DIAGNOSIS — O09899 Supervision of other high risk pregnancies, unspecified trimester: Secondary | ICD-10-CM

## 2013-08-21 ENCOUNTER — Ambulatory Visit (INDEPENDENT_AMBULATORY_CARE_PROVIDER_SITE_OTHER): Payer: Medicaid Other | Admitting: Family Medicine

## 2013-08-21 ENCOUNTER — Encounter: Payer: Self-pay | Admitting: Family Medicine

## 2013-08-21 VITALS — BP 107/72 | HR 80 | Temp 98.4°F | Wt 230.4 lb

## 2013-08-21 DIAGNOSIS — O99321 Drug use complicating pregnancy, first trimester: Secondary | ICD-10-CM

## 2013-08-21 DIAGNOSIS — O99340 Other mental disorders complicating pregnancy, unspecified trimester: Secondary | ICD-10-CM

## 2013-08-21 DIAGNOSIS — O09219 Supervision of pregnancy with history of pre-term labor, unspecified trimester: Secondary | ICD-10-CM

## 2013-08-21 DIAGNOSIS — F1910 Other psychoactive substance abuse, uncomplicated: Secondary | ICD-10-CM

## 2013-08-21 DIAGNOSIS — O09899 Supervision of other high risk pregnancies, unspecified trimester: Secondary | ICD-10-CM

## 2013-08-21 LAB — POCT URINALYSIS DIP (DEVICE)
Bilirubin Urine: NEGATIVE
GLUCOSE, UA: NEGATIVE mg/dL
HGB URINE DIPSTICK: NEGATIVE
Ketones, ur: NEGATIVE mg/dL
NITRITE: NEGATIVE
Protein, ur: NEGATIVE mg/dL
Specific Gravity, Urine: 1.02 (ref 1.005–1.030)
UROBILINOGEN UA: 0.2 mg/dL (ref 0.0–1.0)
pH: 7.5 (ref 5.0–8.0)

## 2013-08-21 NOTE — Patient Instructions (Addendum)
Offices that do circumcisions:  [facility]): [phone] $150 within 4 weeks of birth [facility] [phone] Linna Hoff) [phone] within 4 weeks of birth,  [facility]) $250 within 7 days of birth, [facility] [phone] [facility]) $175 within 2 weeks of birth Second Trimester of Pregnancy The second trimester is from week 13 through week 28, months 4 through 6. The second trimester is often a time when you feel your best. Your body has also adjusted to being pregnant, and you begin to feel better physically. Usually, morning sickness has lessened or quit completely, you may have more energy, and you may have an increase in appetite. The second trimester is also a time when the fetus is growing rapidly. At the end of the sixth month, the fetus is about 9 inches long and weighs about 1 pounds. You will likely begin to feel the baby move (quickening) between 18 and 20 weeks of the pregnancy. BODY CHANGES Your body goes through many changes during pregnancy. The changes vary from woman to woman.   Your weight will continue to increase. You will notice your lower abdomen bulging out.  You may begin to get stretch marks on your hips, abdomen, and breasts.  You may develop headaches that can be relieved by medicines approved by your caregiver.  You may urinate more often because the fetus is pressing on your bladder.  You may develop or continue to have heartburn as a result of your pregnancy.  You may develop constipation because certain hormones are causing the muscles that push waste through your intestines to slow down.  You may develop hemorrhoids or swollen, bulging veins (varicose veins).  You may have back pain because of the weight gain and pregnancy hormones relaxing your joints between the bones in your pelvis and as a result of a shift in weight and the muscles that support your balance.  Your breasts will continue to grow and  be tender.  Your gums may bleed and may be sensitive to brushing and flossing.  Dark spots or blotches (chloasma, mask of pregnancy) may develop on your face. This will likely fade after the baby is born.  A dark line from your belly button to the pubic area (linea nigra) may appear. This will likely fade after the baby is born. WHAT TO EXPECT AT YOUR PRENATAL VISITS During a routine prenatal visit:  You will be weighed to make sure you and the fetus are growing normally.  Your blood pressure will be taken.  Your abdomen will be measured to track your baby's growth.  The fetal heartbeat will be listened to.  Any test results from the previous visit will be discussed. Your caregiver may ask you:  How you are feeling.  If you are feeling the baby move.  If you have had any abnormal symptoms, such as leaking fluid, bleeding, severe headaches, or abdominal cramping.  If you have any questions. Other tests that may be performed during your second trimester include:  Blood tests that check for:  Low iron levels (anemia).  Gestational diabetes (between 24 and 28 weeks).  Rh antibodies.  Urine tests to check for infections, diabetes, or protein in the urine.  An ultrasound to confirm the proper growth and development of the baby.  An amniocentesis to check for possible genetic problems.  Fetal screens for spina bifida and Down syndrome. HOME CARE INSTRUCTIONS   Avoid all smoking, herbs, alcohol, and unprescribed drugs. These chemicals affect the formation and  growth of the baby.  Follow your caregiver's instructions regarding medicine use. There are medicines that are either safe or unsafe to take during pregnancy.  Exercise only as directed by your caregiver. Experiencing uterine cramps is a good sign to stop exercising.  Continue to eat regular, healthy meals.  Wear a good support bra for breast tenderness.  Do not use hot tubs, steam rooms, or saunas.  Wear your  seat belt at all times when driving.  Avoid raw meat, uncooked cheese, cat litter boxes, and soil used by cats. These carry germs that can cause birth defects in the baby.  Take your prenatal vitamins.  Try taking a stool softener (if your caregiver approves) if you develop constipation. Eat more high-fiber foods, such as fresh vegetables or fruit and whole grains. Drink plenty of fluids to keep your urine clear or pale yellow.  Take warm sitz baths to soothe any pain or discomfort caused by hemorrhoids. Use hemorrhoid cream if your caregiver approves.  If you develop varicose veins, wear support hose. Elevate your feet for 15 minutes, 3 4 times a day. Limit salt in your diet.  Avoid heavy lifting, wear low heel shoes, and practice good posture.  Rest with your legs elevated if you have leg cramps or low back pain.  Visit your dentist if you have not gone yet during your pregnancy. Use a soft toothbrush to brush your teeth and be gentle when you floss.  A sexual relationship may be continued unless your caregiver directs you otherwise.  Continue to go to all your prenatal visits as directed by your caregiver. SEEK MEDICAL CARE IF:   You have dizziness.  You have mild pelvic cramps, pelvic pressure, or nagging pain in the abdominal area.  You have persistent nausea, vomiting, or diarrhea.  You have a bad smelling vaginal discharge.  You have pain with urination. SEEK IMMEDIATE MEDICAL CARE IF:   You have a fever.  You are leaking fluid from your vagina.  You have spotting or bleeding from your vagina.  You have severe abdominal cramping or pain.  You have rapid weight gain or loss.  You have shortness of breath with chest pain.  You notice sudden or extreme swelling of your face, hands, ankles, feet, or legs.  You have not felt your baby move in over an hour.  You have severe headaches that do not go away with medicine.  You have vision changes. Document Released:  04/11/2001 Document Revised: 12/18/2012 Document Reviewed: 06/18/2012 [facility] Patient Information 2014 [place].

## 2013-08-21 NOTE — Progress Notes (Signed)
+  FM, no lof, no vb, no ctx  Swelling at end of day, improves with elevation or rest Continue 17p   Desire BTL - will bring medicaid next visit to sign paperwork.  Amy Cordova is a 28 y.o. [id number] at [redacted]w[redacted]d  here for [place] visit.  Discussed with Patient:  -Plans to breast feed.  All questions answered. -Continue prenatal vitamins. - Reviewed fetal kick counts (Pt to perform daily at a time when the baby is active, lie laterally with both hands on belly in quiet room and count all movements (hiccups, shoulder rolls, obvious kicks, etc); pt is to report to clinic or MAU for less than 10 movements felt in a one hour time period-pt told as soon as she counts 10 movements the count is complete.)  - Routine precautions discussed (depression, infection s/s).   Patient provided with all pertinent phone numbers for emergencies. - RTC for any VB, regular, painful cramps/ctxs occurring at a rate of >2/10 min, fever (100.5 or higher), n/v/d, any pain that is unresolving or worsening, LOF, decreased fetal movement, CP, SOB, edema  Problems: Patient Active Problem List   Diagnosis Date Noted  . Hx of preterm delivery, currently pregnant 06/04/2013  . 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

## 2013-08-21 NOTE — Progress Notes (Signed)
C/o of edema in feet; tingle when swollen.

## 2013-08-28 ENCOUNTER — Ambulatory Visit (INDEPENDENT_AMBULATORY_CARE_PROVIDER_SITE_OTHER): Payer: Medicaid Other | Admitting: *Deleted

## 2013-08-28 VITALS — BP 104/70 | HR 77 | Temp 98.4°F | Wt 230.6 lb

## 2013-08-28 DIAGNOSIS — O09219 Supervision of pregnancy with history of pre-term labor, unspecified trimester: Secondary | ICD-10-CM

## 2013-08-28 DIAGNOSIS — O09899 Supervision of other high risk pregnancies, unspecified trimester: Secondary | ICD-10-CM

## 2013-09-04 ENCOUNTER — Encounter: Payer: Medicaid Other | Admitting: Family

## 2013-09-05 ENCOUNTER — Ambulatory Visit (INDEPENDENT_AMBULATORY_CARE_PROVIDER_SITE_OTHER): Payer: Medicaid Other | Admitting: Advanced Practice Midwife

## 2013-09-05 VITALS — BP 103/62 | HR 76 | Temp 97.2°F | Wt 233.7 lb

## 2013-09-05 DIAGNOSIS — Z23 Encounter for immunization: Secondary | ICD-10-CM

## 2013-09-05 DIAGNOSIS — O09899 Supervision of other high risk pregnancies, unspecified trimester: Secondary | ICD-10-CM

## 2013-09-05 DIAGNOSIS — O09219 Supervision of pregnancy with history of pre-term labor, unspecified trimester: Secondary | ICD-10-CM

## 2013-09-05 LAB — CBC
HCT: 31.8 % — ABNORMAL LOW (ref 36.0–46.0)
Hemoglobin: 11 g/dL — ABNORMAL LOW (ref 12.0–15.0)
MCH: 32.4 pg (ref 26.0–34.0)
MCHC: 34.6 g/dL (ref 30.0–36.0)
MCV: 93.8 fL (ref 78.0–100.0)
Platelets: 220 10*3/uL (ref 150–400)
RBC: 3.39 MIL/uL — AB (ref 3.87–5.11)
RDW: 13.1 % (ref 11.5–15.5)
WBC: 5.9 10*3/uL (ref 4.0–10.5)

## 2013-09-05 LAB — POCT URINALYSIS DIP (DEVICE)
Bilirubin Urine: NEGATIVE
Glucose, UA: NEGATIVE mg/dL
HGB URINE DIPSTICK: NEGATIVE
Ketones, ur: NEGATIVE mg/dL
Leukocytes, UA: NEGATIVE
Nitrite: NEGATIVE
PH: 6.5 (ref 5.0–8.0)
PROTEIN: NEGATIVE mg/dL
Specific Gravity, Urine: 1.02 (ref 1.005–1.030)
Urobilinogen, UA: 1 mg/dL (ref 0.0–1.0)

## 2013-09-05 MED ORDER — TETANUS-DIPHTH-ACELL PERTUSSIS 5-2.5-18.5 LF-MCG/0.5 IM SUSP: 0.5000 mL | Freq: Once | INTRAMUSCULAR | Status: DC

## 2013-09-05 NOTE — Progress Notes (Signed)
Doing well.  Good fetal movement, denies vaginal bleeding, LOF, regular contractions.  Reports swelling and "charlie horse" pain in left leg waking her up at night x1 episode, denies pain now.  No edema on exam today.  Negative Homan's sign, no s/sx SVT.  Discussed signs of blood clots/reasons to come to hospital. Recommend walking, increase water intake, stretching/warm bath before bed,Tylenol for pain.  17P today.

## 2013-09-06 LAB — GLUCOSE TOLERANCE, 1 HOUR (50G) W/O FASTING: GLUCOSE 1 HOUR GTT: 87 mg/dL (ref 70–140)

## 2013-09-06 LAB — RPR

## 2013-09-06 LAB — HIV ANTIBODY (ROUTINE TESTING W REFLEX): HIV: NONREACTIVE

## 2013-09-10 ENCOUNTER — Encounter: Payer: Self-pay | Admitting: *Deleted

## 2013-09-11 ENCOUNTER — Ambulatory Visit: Payer: Medicaid Other

## 2013-09-12 ENCOUNTER — Ambulatory Visit (INDEPENDENT_AMBULATORY_CARE_PROVIDER_SITE_OTHER): Payer: Medicaid Other | Admitting: *Deleted

## 2013-09-12 VITALS — BP 100/70 | HR 70 | Wt 235.5 lb

## 2013-09-12 DIAGNOSIS — O09899 Supervision of other high risk pregnancies, unspecified trimester: Secondary | ICD-10-CM

## 2013-09-12 DIAGNOSIS — O09219 Supervision of pregnancy with history of pre-term labor, unspecified trimester: Secondary | ICD-10-CM

## 2013-09-18 ENCOUNTER — Ambulatory Visit (INDEPENDENT_AMBULATORY_CARE_PROVIDER_SITE_OTHER): Payer: Medicaid Other | Admitting: Family Medicine

## 2013-09-18 ENCOUNTER — Encounter: Payer: Self-pay | Admitting: Family Medicine

## 2013-09-18 VITALS — BP 112/72 | HR 65 | Temp 98.2°F | Wt 233.5 lb

## 2013-09-18 DIAGNOSIS — O09219 Supervision of pregnancy with history of pre-term labor, unspecified trimester: Secondary | ICD-10-CM

## 2013-09-18 DIAGNOSIS — O99340 Other mental disorders complicating pregnancy, unspecified trimester: Secondary | ICD-10-CM

## 2013-09-18 DIAGNOSIS — O09899 Supervision of other high risk pregnancies, unspecified trimester: Secondary | ICD-10-CM

## 2013-09-18 DIAGNOSIS — F1910 Other psychoactive substance abuse, uncomplicated: Secondary | ICD-10-CM

## 2013-09-18 DIAGNOSIS — O99321 Drug use complicating pregnancy, first trimester: Secondary | ICD-10-CM

## 2013-09-18 LAB — POCT URINALYSIS DIP (DEVICE)
Bilirubin Urine: NEGATIVE
Glucose, UA: NEGATIVE mg/dL
Hgb urine dipstick: NEGATIVE
KETONES UR: NEGATIVE mg/dL
Nitrite: NEGATIVE
PH: 7.5 (ref 5.0–8.0)
PROTEIN: NEGATIVE mg/dL
Specific Gravity, Urine: 1.015 (ref 1.005–1.030)
UROBILINOGEN UA: 0.2 mg/dL (ref 0.0–1.0)

## 2013-09-18 NOTE — Progress Notes (Signed)
Denies Ctx's, LOF. Has taken water birth class and has certificate Discussed liner and hoses. Continue 17 P today

## 2013-09-18 NOTE — Patient Instructions (Signed)
Third Trimester of Pregnancy The third trimester is from week 29 through week 42, months 7 through 9. The third trimester is a time when the fetus is growing rapidly. At the end of the ninth month, the fetus is about 20 inches in length and weighs 6 10 pounds.  BODY CHANGES Your body goes through many changes during pregnancy. The changes vary from woman to woman.   Your weight will continue to increase. You can expect to gain 25 35 pounds (11 16 kg) by the end of the pregnancy.  You may begin to get stretch marks on your hips, abdomen, and breasts.  You may urinate more often because the fetus is moving lower into your pelvis and pressing on your bladder.  You may develop or continue to have heartburn as a result of your pregnancy.  You may develop constipation because certain hormones are causing the muscles that push waste through your intestines to slow down.  You may develop hemorrhoids or swollen, bulging veins (varicose veins).  You may have pelvic pain because of the weight gain and pregnancy hormones relaxing your joints between the bones in your pelvis. Back aches may result from over exertion of the muscles supporting your posture.  Your breasts will continue to grow and be tender. A yellow discharge may leak from your breasts called colostrum.  Your belly button may stick out.  You may feel short of breath because of your expanding uterus.  You may notice the fetus "dropping," or moving lower in your abdomen.  You may have a bloody mucus discharge. This usually occurs a few days to a week before labor begins.  Your cervix becomes thin and soft (effaced) near your due date. WHAT TO EXPECT AT YOUR PRENATAL EXAMS  You will have prenatal exams every 2 weeks until week 36. Then, you will have weekly prenatal exams. During a routine prenatal visit:  You will be weighed to make sure you and the fetus are growing normally.  Your blood pressure is taken.  Your abdomen will  be measured to track your baby's growth.  The fetal heartbeat will be listened to.  Any test results from the previous visit will be discussed.  You may have a cervical check near your due date to see if you have effaced. At around 36 weeks, your caregiver will check your cervix. At the same time, your caregiver will also perform a test on the secretions of the vaginal tissue. This test is to determine if a type of bacteria, Group B streptococcus, is present. Your caregiver will explain this further. Your caregiver may ask you:  What your birth plan is.  How you are feeling.  If you are feeling the baby move.  If you have had any abnormal symptoms, such as leaking fluid, bleeding, severe headaches, or abdominal cramping.  If you have any questions. Other tests or screenings that may be performed during your third trimester include:  Blood tests that check for low iron levels (anemia).  Fetal testing to check the health, activity level, and growth of the fetus. Testing is done if you have certain medical conditions or if there are problems during the pregnancy. FALSE LABOR You may feel small, irregular contractions that eventually go away. These are called Braxton Hicks contractions, or false labor. Contractions may last for hours, days, or even weeks before true labor sets in. If contractions come at regular intervals, intensify, or become painful, it is best to be seen by your caregiver.    SIGNS OF LABOR   Menstrual-like cramps.  Contractions that are 5 minutes apart or less.  Contractions that start on the top of the uterus and spread down to the lower abdomen and back.  A sense of increased pelvic pressure or back pain.  A watery or bloody mucus discharge that comes from the vagina. If you have any of these signs before the 37th week of pregnancy, call your caregiver right away. You need to go to the hospital to get checked immediately. HOME CARE INSTRUCTIONS   Avoid all  smoking, herbs, alcohol, and unprescribed drugs. These chemicals affect the formation and growth of the baby.  Follow your caregiver's instructions regarding medicine use. There are medicines that are either safe or unsafe to take during pregnancy.  Exercise only as directed by your caregiver. Experiencing uterine cramps is a good sign to stop exercising.  Continue to eat regular, healthy meals.  Wear a good support bra for breast tenderness.  Do not use hot tubs, steam rooms, or saunas.  Wear your seat belt at all times when driving.  Avoid raw meat, uncooked cheese, cat litter boxes, and soil used by cats. These carry germs that can cause birth defects in the baby.  Take your prenatal vitamins.  Try taking a stool softener (if your caregiver approves) if you develop constipation. Eat more high-fiber foods, such as fresh vegetables or fruit and whole grains. Drink plenty of fluids to keep your urine clear or pale yellow.  Take warm sitz baths to soothe any pain or discomfort caused by hemorrhoids. Use hemorrhoid cream if your caregiver approves.  If you develop varicose veins, wear support hose. Elevate your feet for 15 minutes, 3 4 times a day. Limit salt in your diet.  Avoid heavy lifting, wear low heal shoes, and practice good posture.  Rest a lot with your legs elevated if you have leg cramps or low back pain.  Visit your dentist if you have not gone during your pregnancy. Use a soft toothbrush to brush your teeth and be gentle when you floss.  A sexual relationship may be continued unless your caregiver directs you otherwise.  Do not travel far distances unless it is absolutely necessary and only with the approval of your caregiver.  Take prenatal classes to understand, practice, and ask questions about the labor and delivery.  Make a trial run to the hospital.  Pack your hospital bag.  Prepare the baby's nursery.  Continue to go to all your prenatal visits as directed  by your caregiver. SEEK MEDICAL CARE IF:  You are unsure if you are in labor or if your water has broken.  You have dizziness.  You have mild pelvic cramps, pelvic pressure, or nagging pain in your abdominal area.  You have persistent nausea, vomiting, or diarrhea.  You have a bad smelling vaginal discharge.  You have pain with urination. SEEK IMMEDIATE MEDICAL CARE IF:   You have a fever.  You are leaking fluid from your vagina.  You have spotting or bleeding from your vagina.  You have severe abdominal cramping or pain.  You have rapid weight loss or gain.  You have shortness of breath with chest pain.  You notice sudden or extreme swelling of your face, hands, ankles, feet, or legs.  You have not felt your baby move in over an hour.  You have severe headaches that do not go away with medicine.  You have vision changes. Document Released: 04/11/2001 Document Revised: 12/18/2012 Document Reviewed:   06/18/2012 [employer] Patient Information 2014 [employer].  Breastfeeding Deciding to breastfeed is one of the best choices you can make for you and your baby. A change in hormones during pregnancy causes your breast tissue to grow and increases the number and size of your milk ducts. These hormones also allow proteins, sugars, and fats from your blood supply to make breast milk in your milk-producing glands. Hormones prevent breast milk from being released before your baby is born as well as prompt milk flow after birth. Once breastfeeding has begun, thoughts of your baby, as well as his or her sucking or crying, can stimulate the release of milk from your milk-producing glands.  BENEFITS OF BREASTFEEDING For Your Baby  Your first milk (colostrum) helps your baby's digestive system function better.   There are antibodies in your milk that help your baby fight off infections.   Your baby has a lower incidence of asthma, allergies, and sudden infant death syndrome.    The nutrients in breast milk are better for your baby than infant formulas and are designed uniquely for your baby's needs.   Breast milk improves your baby's brain development.   Your baby is less likely to develop other conditions, such as childhood obesity, asthma, or type 2 diabetes mellitus.  For You   Breastfeeding helps to create a very special bond between you and your baby.   Breastfeeding is convenient. Breast milk is always available at the correct temperature and costs nothing.   Breastfeeding helps to burn calories and helps you lose the weight gained during pregnancy.   Breastfeeding makes your uterus contract to its prepregnancy size faster and slows bleeding (lochia) after you give birth.   Breastfeeding helps to lower your risk of developing type 2 diabetes mellitus, osteoporosis, and breast or ovarian cancer later in life. SIGNS THAT YOUR BABY IS HUNGRY Early Signs of Hunger  Increased alertness or activity.  Stretching.  Movement of the head from side to side.  Movement of the head and opening of the mouth when the corner of the mouth or cheek is stroked (rooting).  Increased sucking sounds, smacking lips, cooing, sighing, or squeaking.  Hand-to-mouth movements.  Increased sucking of fingers or hands. Late Signs of Hunger  Fussing.  Intermittent crying. Extreme Signs of Hunger Signs of extreme hunger will require calming and consoling before your baby will be able to breastfeed successfully. Do not wait for the following signs of extreme hunger to occur before you initiate breastfeeding:   Restlessness.  A loud, strong cry.   Screaming. BREASTFEEDING BASICS Breastfeeding Initiation  Find a comfortable place to sit or lie down, with your neck and back well supported.  Place a pillow or rolled up blanket under your baby to bring him or her to the level of your breast (if you are seated). Nursing pillows are specially designed to help  support your arms and your baby while you breastfeed.  Make sure that your baby's abdomen is facing your abdomen.   Gently massage your breast. With your fingertips, massage from your chest wall toward your nipple in a circular motion. This encourages milk flow. You may need to continue this action during the feeding if your milk flows slowly.  Support your breast with 4 fingers underneath and your thumb above your nipple. Make sure your fingers are well away from your nipple and your baby's mouth.   Stroke your baby's lips gently with your finger or nipple.   When your baby's mouth is   open wide enough, quickly bring your baby to your breast, placing your entire nipple and as much of the colored area around your nipple (areola) as possible into your baby's mouth.   More areola should be visible above your baby's upper lip than below the lower lip.   Your baby's tongue should be between his or her lower gum and your breast.   Ensure that your baby's mouth is correctly positioned around your nipple (latched). Your baby's lips should create a seal on your breast and be turned out (everted).  It is common for your baby to suck about 2 3 minutes in order to start the flow of breast milk. Latching Teaching your baby how to latch on to your breast properly is very important. An improper latch can cause nipple pain and decreased milk supply for you and poor weight gain in your baby. Also, if your baby is not latched onto your nipple properly, he or she may swallow some air during feeding. This can make your baby fussy. Burping your baby when you switch breasts during the feeding can help to get rid of the air. However, teaching your baby to latch on properly is still the best way to prevent fussiness from swallowing air while breastfeeding. Signs that your baby has successfully latched on to your nipple:    Silent tugging or silent sucking, without causing you pain.   Swallowing heard  between every 3 4 sucks.    Muscle movement above and in front of his or her ears while sucking.  Signs that your baby has not successfully latched on to nipple:   Sucking sounds or smacking sounds from your baby while breastfeeding.  Nipple pain. If you think your baby has not latched on correctly, slip your finger into the corner of your baby's mouth to break the suction and place it between your baby's gums. Attempt breastfeeding initiation again. Signs of Successful Breastfeeding Signs from your baby:   A gradual decrease in the number of sucks or complete cessation of sucking.   Falling asleep.   Relaxation of his or her body.   Retention of a small amount of milk in his or her mouth.   Letting go of your breast by himself or herself. Signs from you:  Breasts that have increased in firmness, weight, and size 1 3 hours after feeding.   Breasts that are softer immediately after breastfeeding.  Increased milk volume, as well as a change in milk consistency and color by the 5th day of breastfeeding.   Nipples that are not sore, cracked, or bleeding. Signs That Your Baby is Getting Enough Milk  Wetting at least 3 diapers in a 24-hour period. The urine should be clear and pale yellow by age 5 days.  At least 3 stools in a 24-hour period by age 5 days. The stool should be soft and yellow.  At least 3 stools in a 24-hour period by age 7 days. The stool should be seedy and yellow.  No loss of weight greater than 10% of birth weight during the first 3 days of age.  Average weight gain of 4 7 ounces (120 210 mL) per week after age 4 days.  Consistent daily weight gain by age 5 days, without weight loss after the age of 2 weeks. After a feeding, your baby may spit up a small amount. This is common. BREASTFEEDING FREQUENCY AND DURATION Frequent feeding will help you make more milk and can prevent sore nipples and breast engorgement.   Breastfeed when you feel the need to  reduce the fullness of your breasts or when your baby shows signs of hunger. This is called "breastfeeding on demand." Avoid introducing a pacifier to your baby while you are working to establish breastfeeding (the first 4 6 weeks after your baby is born). After this time you may choose to use a pacifier. Research has shown that pacifier use during the first year of a baby's life decreases the risk of sudden infant death syndrome (SIDS). Allow your baby to feed on each breast as long as he or she wants. Breastfeed until your baby is finished feeding. When your baby unlatches or falls asleep while feeding from the first breast, offer the second breast. Because newborns are often sleepy in the first few weeks of life, you may need to awaken your baby to get him or her to feed. Breastfeeding times will vary from baby to baby. However, the following rules can serve as a guide to help you ensure that your baby is properly fed:  Newborns (babies 4 weeks of age or younger) may breastfeed every 1 3 hours.  Newborns should not go longer than 3 hours during the day or 5 hours during the night without breastfeeding.  You should breastfeed your baby a minimum of 8 times in a 24-hour period until you begin to introduce solid foods to your baby at around 6 months of age. BREAST MILK PUMPING Pumping and storing breast milk allows you to ensure that your baby is exclusively fed your breast milk, even at times when you are unable to breastfeed. This is especially important if you are going back to work while you are still breastfeeding or when you are not able to be present during feedings. Your lactation consultant can give you guidelines on how long it is safe to store breast milk.  A breast pump is a machine that allows you to pump milk from your breast into a sterile bottle. The pumped breast milk can then be stored in a refrigerator or freezer. Some breast pumps are operated by hand, while others use electricity. Ask  your lactation consultant which type will work best for you. Breast pumps can be purchased, but some hospitals and breastfeeding support groups lease breast pumps on a monthly basis. A lactation consultant can teach you how to hand express breast milk, if you prefer not to use a pump.  CARING FOR YOUR BREASTS WHILE YOU BREASTFEED Nipples can become dry, cracked, and sore while breastfeeding. The following recommendations can help keep your breasts moisturized and healthy:  Avoid using soap on your nipples.   Wear a supportive bra. Although not required, special nursing bras and tank tops are designed to allow access to your breasts for breastfeeding without taking off your entire bra or top. Avoid wearing underwire style bras or extremely tight bras.  Air dry your nipples for 3 4minutes after each feeding.   Use only cotton bra pads to absorb leaked breast milk. Leaking of breast milk between feedings is normal.   Use lanolin on your nipples after breastfeeding. Lanolin helps to maintain your skin's normal moisture barrier. If you use pure lanolin you do not need to wash it off before feeding your baby again. Pure lanolin is not toxic to your baby. You may also hand express a few drops of breast milk and gently massage that milk into your nipples and allow the milk to air dry. In the first few weeks after giving birth, some women   experience extremely full breasts (engorgement). Engorgement can make your breasts feel heavy, warm, and tender to the touch. Engorgement peaks within 3 5 days after you give birth. The following recommendations can help ease engorgement:  Completely empty your breasts while breastfeeding or pumping. You may want to start by applying warm, moist heat (in the shower or with warm water-soaked hand towels) just before feeding or pumping. This increases circulation and helps the milk flow. If your baby does not completely empty your breasts while breastfeeding, pump any extra  milk after he or she is finished.  Wear a snug bra (nursing or regular) or tank top for 1 2 days to signal your body to slightly decrease milk production.  Apply ice packs to your breasts, unless this is too uncomfortable for you.  Make sure that your baby is latched on and positioned properly while breastfeeding. If engorgement persists after 48 hours of following these recommendations, contact your health care provider or a lactation consultant. OVERALL HEALTH CARE RECOMMENDATIONS WHILE BREASTFEEDING  Eat healthy foods. Alternate between meals and snacks, eating 3 of each per day. Because what you eat affects your breast milk, some of the foods may make your baby more irritable than usual. Avoid eating these foods if you are sure that they are negatively affecting your baby.  Drink milk, fruit juice, and water to satisfy your thirst (about 10 glasses a day).   Rest often, relax, and continue to take your prenatal vitamins to prevent fatigue, stress, and anemia.  Continue breast self-awareness checks.  Avoid chewing and smoking tobacco.  Avoid alcohol and drug use. Some medicines that may be harmful to your baby can pass through breast milk. It is important to ask your health care provider before taking any medicine, including all over-the-counter and prescription medicine as well as vitamin and herbal supplements. It is possible to become pregnant while breastfeeding. If birth control is desired, ask your health care provider about options that will be safe for your baby. SEEK MEDICAL CARE IF:   You feel like you want to stop breastfeeding or have become frustrated with breastfeeding.  You have painful breasts or nipples.  Your nipples are cracked or bleeding.  Your breasts are red, tender, or warm.  You have a swollen area on either breast.  You have a fever or chills.  You have nausea or vomiting.  You have drainage other than breast milk from your nipples.  Your breasts  do not become full before feedings by the 5th day after you give birth.  You feel sad and depressed.  Your baby is too sleepy to eat well.  Your baby is having trouble sleeping.   Your baby is wetting less than 3 diapers in a 24-hour period.  Your baby has less than 3 stools in a 24-hour period.  Your baby's skin or the white part of his or her eyes becomes yellow.   Your baby is not gaining weight by 5 days of age. SEEK IMMEDIATE MEDICAL CARE IF:   Your baby is overly tired (lethargic) and does not want to wake up and feed.  Your baby develops an unexplained fever. Document Released: 04/17/2005 Document Revised: 12/18/2012 Document Reviewed: 10/09/2012 [employer] Patient Information 2014 [employer].  

## 2013-09-25 ENCOUNTER — Ambulatory Visit (INDEPENDENT_AMBULATORY_CARE_PROVIDER_SITE_OTHER): Payer: Medicaid Other | Admitting: *Deleted

## 2013-09-25 VITALS — BP 113/78 | HR 90 | Temp 98.3°F

## 2013-09-25 DIAGNOSIS — O09899 Supervision of other high risk pregnancies, unspecified trimester: Secondary | ICD-10-CM

## 2013-09-25 DIAGNOSIS — O09219 Supervision of pregnancy with history of pre-term labor, unspecified trimester: Secondary | ICD-10-CM

## 2013-10-02 ENCOUNTER — Telehealth: Payer: Self-pay

## 2013-10-02 ENCOUNTER — Ambulatory Visit (INDEPENDENT_AMBULATORY_CARE_PROVIDER_SITE_OTHER): Payer: Medicaid Other | Admitting: Obstetrics & Gynecology

## 2013-10-02 VITALS — BP 118/78 | HR 77 | Temp 97.5°F | Wt 236.8 lb

## 2013-10-02 DIAGNOSIS — O09899 Supervision of other high risk pregnancies, unspecified trimester: Secondary | ICD-10-CM

## 2013-10-02 DIAGNOSIS — O09219 Supervision of pregnancy with history of pre-term labor, unspecified trimester: Secondary | ICD-10-CM

## 2013-10-02 LAB — POCT URINALYSIS DIP (DEVICE)
Bilirubin Urine: NEGATIVE
Glucose, UA: NEGATIVE mg/dL
Hgb urine dipstick: NEGATIVE
Ketones, ur: NEGATIVE mg/dL
Leukocytes, UA: NEGATIVE
Nitrite: NEGATIVE
PH: 7 (ref 5.0–8.0)
PROTEIN: NEGATIVE mg/dL
SPECIFIC GRAVITY, URINE: 1.02 (ref 1.005–1.030)
Urobilinogen, UA: 0.2 mg/dL (ref 0.0–1.0)

## 2013-10-02 LAB — FETAL FIBRONECTIN: Fetal Fibronectin: POSITIVE — AB

## 2013-10-02 NOTE — Telephone Encounter (Addendum)
Received call from lab stating patient's FFN was positive. Attempted to call Dr. Gala Romney on cell phone regarding plan of care for patient-- no answer. Left message informing Dr. Gala Romney of results and request to call clinic to inform us of plan of care so that we may call patient. No notes in chart regarding FFN done, reason for it or plan of care. Dr. Kennon Rounds attempted to get a hold of Dr. Gala Romney, suggests to address tomorrow if she does not get a hold of her tonight.   Cathlean Marseilles spoke to Dr. Gala Romney via phone-- plan is to have patient come in for betamethasone and second dose Saturday in [facility]. Attempted to call patient-- no answer on home phone-- left message stating we are trying to reach you regarding lab done yesterday and appointment today, please call clinic. Mobile number invalid. Attempted to call patient's emergency contact--- no answer.   Called patient second time-- informed patient of results and the need for betamethasone today and one dose in 24 hours. Patient verbalized understanding and states she will be here at 1000.

## 2013-10-02 NOTE — Addendum Note (Signed)
Addended by: Christiana Pellant A on: 10/02/2013 12:08 PM   Modules accepted: Orders

## 2013-10-02 NOTE — Progress Notes (Signed)
Waterbirth-needs to [facility] with midwife next week for details.  Has attended class. Pt c/o vaginal pressure.  Cervix is long, high, and internal os is not reached.  Very posterior.   No evidence of PTL. Cont 17P.  UDS today for history of positive drug screen this pregnancy.

## 2013-10-02 NOTE — Progress Notes (Signed)
Edema-left foot  Pressure-vaginal

## 2013-10-03 ENCOUNTER — Ambulatory Visit (INDEPENDENT_AMBULATORY_CARE_PROVIDER_SITE_OTHER): Payer: Medicaid Other

## 2013-10-03 VITALS — BP 114/77 | HR 66 | Temp 98.8°F | Wt 238.0 lb

## 2013-10-03 DIAGNOSIS — R8789 Other abnormal findings in specimens from female genital organs: Secondary | ICD-10-CM

## 2013-10-03 DIAGNOSIS — R899 Unspecified abnormal finding in specimens from other organs, systems and tissues: Secondary | ICD-10-CM

## 2013-10-03 DIAGNOSIS — O09899 Supervision of other high risk pregnancies, unspecified trimester: Principal | ICD-10-CM

## 2013-10-03 MED ORDER — BETAMETHASONE SOD PHOS & ACET 6 (3-3) MG/ML IJ SUSP
12.0000 mg | Freq: Once | INTRAMUSCULAR | Status: AC
  Administered 2013-10-03: 12 mg via INTRAMUSCULAR

## 2013-10-03 NOTE — Progress Notes (Signed)
Patient here today for injection of betamethasone dose #1 for positive FFN. Injection given and tolerated well. Patient instructed to abstain from sexual intercourse until 36 weeks. Also instructed she will need to return to [facility] tomorrow 10/04/13 at 1000 for second dose. Patient verbalized understanding and stated she will be there.  Report called and given to [facility] charge nurse who stated she would put it in the charge book so that they will be aware patient is coming.

## 2013-10-04 ENCOUNTER — Inpatient Hospital Stay (HOSPITAL_COMMUNITY)
Admission: AD | Admit: 2013-10-04 | Discharge: 2013-10-04 | Disposition: A | Discharge status: Home / Self Care | Payer: Medicaid Other | Source: Ambulatory Visit | Attending: Obstetrics & Gynecology | Admitting: Obstetrics & Gynecology

## 2013-10-04 DIAGNOSIS — O4700 False labor before 37 completed weeks of gestation, unspecified trimester: Secondary | ICD-10-CM | POA: Insufficient documentation

## 2013-10-04 LAB — PRESCRIPTION MONITORING PROFILE (19 PANEL)
Amphetamine/Meth: NEGATIVE ng/mL
BARBITURATE SCREEN, URINE: NEGATIVE ng/mL
BENZODIAZEPINE SCREEN, URINE: NEGATIVE ng/mL
BUPRENORPHINE, URINE: NEGATIVE ng/mL
COCAINE METABOLITES: NEGATIVE ng/mL
Cannabinoid Scrn, Ur: NEGATIVE ng/mL
Carisoprodol, Urine: NEGATIVE ng/mL
Creatinine, Urine: 150.29 mg/dL (ref >20.0)
FENTANYL URINE: NEGATIVE ng/mL
MDMA URINE: NEGATIVE ng/mL
METHAQUALONE SCREEN (URINE): NEGATIVE ng/mL
Meperidine, Ur: NEGATIVE ng/mL
Methadone Screen, Urine: NEGATIVE ng/mL
Nitrites, Initial: NEGATIVE ug/mL
OPIATE SCREEN, URINE: NEGATIVE ng/mL
Oxycodone Screen, Ur: NEGATIVE ng/mL
Phencyclidine, Ur: NEGATIVE ng/mL
Propoxyphene: NEGATIVE ng/mL
TRAMADOL UR: NEGATIVE ng/mL
Tapentadol, urine: NEGATIVE ng/mL
ZOLPIDEM, URINE: NEGATIVE ng/mL
pH, Initial: 7.4 pH (ref 4.5–8.9)

## 2013-10-04 MED ORDER — BETAMETHASONE SOD PHOS & ACET 6 (3-3) MG/ML IJ SUSP
12.0000 mg | Freq: Once | INTRAMUSCULAR | Status: AC
  Administered 2013-10-04: 12 mg via INTRAMUSCULAR
  Filled 2013-10-04: qty 2

## 2013-10-08 ENCOUNTER — Inpatient Hospital Stay (HOSPITAL_COMMUNITY)
Admission: AD | Admit: 2013-10-08 | Discharge: 2013-10-08 | Disposition: A | Discharge status: Home / Self Care | Payer: Medicaid Other | Source: Ambulatory Visit | Attending: Family Medicine | Admitting: Family Medicine

## 2013-10-08 ENCOUNTER — Encounter (HOSPITAL_COMMUNITY): Payer: Self-pay | Admitting: *Deleted

## 2013-10-08 DIAGNOSIS — O10019 Pre-existing essential hypertension complicating pregnancy, unspecified trimester: Secondary | ICD-10-CM | POA: Insufficient documentation

## 2013-10-08 DIAGNOSIS — O4700 False labor before 37 completed weeks of gestation, unspecified trimester: Secondary | ICD-10-CM | POA: Insufficient documentation

## 2013-10-08 DIAGNOSIS — O479 False labor, unspecified: Secondary | ICD-10-CM

## 2013-10-08 LAB — URINALYSIS, ROUTINE W REFLEX MICROSCOPIC
BILIRUBIN URINE: NEGATIVE
Glucose, UA: NEGATIVE mg/dL
Hgb urine dipstick: NEGATIVE
Ketones, ur: NEGATIVE mg/dL
LEUKOCYTES UA: NEGATIVE
Nitrite: NEGATIVE
PH: 6 (ref 5.0–8.0)
Protein, ur: NEGATIVE mg/dL
SPECIFIC GRAVITY, URINE: 1.005 (ref 1.005–1.030)
Urobilinogen, UA: 0.2 mg/dL (ref 0.0–1.0)

## 2013-10-08 NOTE — [facility] Provider Note (Signed)
Write a note.

## 2013-10-08 NOTE — [facility] Note (Signed)
Pt c/o abd pain and tightening on and off since this afternoon. C/o pelvic pressure and discomfort.

## 2013-10-08 NOTE — MAU Provider Note (Signed)
Chief Complaint:  Contractions   First Provider Initiated Contact with Patient 10/08/13 [phone]      HPI: Amy Cordova is a 28 y.o. [id number] at 23w5dwho presents to maternity admissions reporting pelvic pressure and contractions starting this morning and worsening over the course of the day. She has hx of preterm birth x1 at 28 weeks and is on 17-P this pregnancy.  She had FFN in office on 10/02/13 which was positive and received 2 doses of betamethasone this week.  She reports good fetal movement, denies LOF, vaginal bleeding, vaginal itching/burning, urinary symptoms, h/a, dizziness, n/v, or fever/chills.   Past Medical History: Past Medical History  Diagnosis Date  . Hypertension   . Cervical cancer   . Abnormal Pap smear 2000    cervical - laser treatment  . Pregnancy induced hypertension   . Headache(784.0)   . Urinary tract infection   . Trichomonas     Past obstetric history: OB History  Gravida Para Term Preterm AB SAB TAB Ectopic Multiple Living  4 2 1 1 1 1  0 0 0 2    # Outcome Date GA Lbr Len/2nd Weight Sex Delivery Anes PTL Lv  4 CUR           3 PRE 06/09/06 [redacted]w[redacted]d  1.417 kg (3 lb 2 oz) F SVD None Y Y     Comments: hx of LEEP  2 SAB 2005          1 TRM 04/17/03 [redacted]w[redacted]d  3.26 kg (7 lb 3 oz) F SVD None N Y     Comments: Preeclampsia      Past Surgical History: Past Surgical History  Procedure Laterality Date  . Laser ablation condyloma cervical / vulvar      Family History: Family History  Problem Relation Age of Onset  . Hypertension Mother   . Diabetes Mother   . Diabetes Father   . Hypertension Father   . Other Neg Hx     Social History: History  Substance Use Topics  . Smoking status: Never Smoker   . Smokeless tobacco: Never Used  . Alcohol Use: No    Allergies:  Allergies  Allergen Reactions  . Other Other (See Comments)    Pt tends to get yeast infections from antibiotics. She can take antibiotics as long as she is given fluconazole.    Meds:   Facility-administered medications prior to admission  Medication Dose Route Frequency Provider Last Rate Last Dose  . hydroxyprogesterone caproate (DELALUTIN) 250 mg/mL injection 250 mg  250 mg Intramuscular Weekly Allen Norris, MD   250 mg at 10/02/13 1147   Prescriptions prior to admission  Medication Sig Dispense Refill  . Prenatal Vit-Fe Fumarate-FA (PRENATAL MULTIVITAMIN) TABS tablet Take 1 tablet by mouth at bedtime.        ROS: Pertinent findings in history of present illness.  Physical Exam  Blood pressure 124/73, pulse 61, temperature 98.4 F (36.9 C), temperature source Oral, resp. rate 18, height 5\' 6"  (1.676 m), weight 106.142 kg (234 lb), last menstrual period 02/21/2013. GENERAL: Well-developed, well-nourished female in no acute distress.  HEENT: normocephalic HEART: normal rate RESP: normal effort ABDOMEN: Soft, non-tender, gravid appropriate for gestational age EXTREMITIES: Nontender, no edema NEURO: alert and oriented SPECULUM EXAM: NEFG, physiologic discharge, no blood, cervix clean Dilation: Closed Exam by:: Fatima Blank CNM  FHT:  Baseline 135, moderate variability, accelerations present, no decelerations Contractions: None on toco or to palpation   Labs: Results for orders placed during  the hospital encounter of 10/08/13 (from the past 24 hour(s))  URINALYSIS, ROUTINE W REFLEX MICROSCOPIC     Status: None   Collection Time    10/08/13  1:15 AM      Result Value Ref Range   Color, Urine YELLOW  YELLOW   APPearance CLEAR  CLEAR   Specific Gravity, Urine 1.005  1.005 - 1.030   pH 6.0  5.0 - 8.0   Glucose, UA NEGATIVE  NEGATIVE mg/dL   Hgb urine dipstick NEGATIVE  NEGATIVE   Bilirubin Urine NEGATIVE  NEGATIVE   Ketones, ur NEGATIVE  NEGATIVE mg/dL   Protein, ur NEGATIVE  NEGATIVE mg/dL   Urobilinogen, UA 0.2  0.0 - 1.0 mg/dL   Nitrite NEGATIVE  NEGATIVE   Leukocytes, UA NEGATIVE  NEGATIVE    Imaging:  No results found. ED  Course   Assessment: 1. Preterm contractions     Plan: Discharge home Labor precautions and fetal kick counts Follow-up Information   Follow up with [facility] (As scheduled or as needed if symptoms worsen)    Specialty:  Obstetrics and Gynecology   Contact information:   [place] [address] [phone]      Follow up with [facility]. (As needed in emergencies)    Contact information:   [address] [id number] [place] [address] [phone]       Medication List         prenatal multivitamin Tabs tablet  Take 1 tablet by mouth at bedtime.        Fatima Blank Certified Nurse-Midwife 10/08/2013 3:44 AMprat

## 2013-10-08 NOTE — Discharge Instructions (Signed)
Preterm Labor Information Preterm labor is when labor starts at less than 37 weeks of pregnancy. The normal length of a pregnancy is 39 to 41 weeks. CAUSES Often, there is no identifiable underlying cause as to why a woman goes into preterm labor. One of the most common known causes of preterm labor is infection. Infections of the uterus, cervix, vagina, amniotic sac, bladder, kidney, or even the lungs (pneumonia) can cause labor to start. Other suspected causes of preterm labor include:   Urogenital infections, such as yeast infections and bacterial vaginosis.   Uterine abnormalities (uterine shape, uterine septum, fibroids, or bleeding from the placenta).   A cervix that has been operated on (it may fail to stay closed).   Malformations in the fetus.   Multiple gestations (twins, triplets, and so on).   Breakage of the amniotic sac.  RISK FACTORS  Having a previous history of preterm labor.   Having premature rupture of membranes (PROM).   Having a placenta that covers the opening of the cervix (placenta previa).   Having a placenta that separates from the uterus (placental abruption).   Having a cervix that is too weak to hold the fetus in the uterus (incompetent cervix).   Having too much fluid in the amniotic sac (polyhydramnios).   Taking illegal drugs or smoking while pregnant.   Not gaining enough weight while pregnant.   Being younger than 44 and older than 29 years old.   Having a low socioeconomic status.   Being African American. SYMPTOMS Signs and symptoms of preterm labor include:   Menstrual-like cramps, abdominal pain, or back pain.  Uterine contractions that are regular, as frequent as six in an hour, regardless of their intensity (may be mild or painful).  Contractions that start on the top of the uterus and spread down to the lower abdomen and back.   A sense of increased pelvic pressure.   A watery or bloody mucus discharge that  comes from the vagina.  TREATMENT Depending on the length of the pregnancy and other circumstances, your health care provider may suggest bed rest. If necessary, there are medicines that can be given to stop contractions and to mature the fetal lungs. If labor happens before 34 weeks of pregnancy, a prolonged hospital stay may be recommended. Treatment depends on the condition of both you and the fetus.  WHAT SHOULD YOU DO IF YOU THINK YOU ARE IN PRETERM LABOR? Call your health care provider right away. You will need to go to the hospital to get checked immediately. HOW CAN YOU PREVENT PRETERM LABOR IN FUTURE PREGNANCIES? You should:   Stop smoking if you smoke.  Maintain healthy weight gain and avoid chemicals and drugs that are not necessary.  Be watchful for any type of infection.  Inform your health care provider if you have a known history of preterm labor. Document Released: 07/08/2003 Document Revised: 12/18/2012 Document Reviewed: 05/20/2012 [facility] Patient Information 2014 [place], [place].   Preterm Labor Information Preterm labor is when labor starts at less than 37 weeks of pregnancy. The normal length of a pregnancy is 39 to 41 weeks. CAUSES Often, there is no identifiable underlying cause as to why a woman goes into preterm labor. One of the most common known causes of preterm labor is infection. Infections of the uterus, cervix, vagina, amniotic sac, bladder, kidney, or even the lungs (pneumonia) can cause labor to start. Other suspected causes of preterm labor include:   Urogenital infections, such as yeast infections and  bacterial vaginosis.   Uterine abnormalities (uterine shape, uterine septum, fibroids, or bleeding from the placenta).   A cervix that has been operated on (it may fail to stay closed).   Malformations in the fetus.   Multiple gestations (twins, triplets, and so on).   Breakage of the amniotic sac.  RISK FACTORS  Having a previous history  of preterm labor.   Having premature rupture of membranes (PROM).   Having a placenta that covers the opening of the cervix (placenta previa).   Having a placenta that separates from the uterus (placental abruption).   Having a cervix that is too weak to hold the fetus in the uterus (incompetent cervix).   Having too much fluid in the amniotic sac (polyhydramnios).   Taking illegal drugs or smoking while pregnant.   Not gaining enough weight while pregnant.   Being younger than 12 and older than 29 years old.   Having a low socioeconomic status.   Being African American. SYMPTOMS Signs and symptoms of preterm labor include:   Menstrual-like cramps, abdominal pain, or back pain.  Uterine contractions that are regular, as frequent as six in an hour, regardless of their intensity (may be mild or painful).  Contractions that start on the top of the uterus and spread down to the lower abdomen and back.   A sense of increased pelvic pressure.   A watery or bloody mucus discharge that comes from the vagina.  TREATMENT Depending on the length of the pregnancy and other circumstances, your health care provider may suggest bed rest. If necessary, there are medicines that can be given to stop contractions and to mature the fetal lungs. If labor happens before 34 weeks of pregnancy, a prolonged hospital stay may be recommended. Treatment depends on the condition of both you and the fetus.  WHAT SHOULD YOU DO IF YOU THINK YOU ARE IN PRETERM LABOR? Call your health care provider right away. You will need to go to the hospital to get checked immediately. HOW CAN YOU PREVENT PRETERM LABOR IN FUTURE PREGNANCIES? You should:   Stop smoking if you smoke.  Maintain healthy weight gain and avoid chemicals and drugs that are not necessary.  Be watchful for any type of infection.  Inform your health care provider if you have a known history of preterm labor. Document Released:  07/08/2003 Document Revised: 12/18/2012 Document Reviewed: 05/20/2012 [facility] Patient Information 2014 [place], [place].

## 2013-10-09 ENCOUNTER — Ambulatory Visit: Payer: Medicaid Other

## 2013-10-09 NOTE — MAU Provider Note (Signed)
Attestation of Attending Supervision of Advanced Practitioner (PA/CNM/NP): Evaluation and management procedures were performed by the Advanced Practitioner under my supervision and collaboration.  I have reviewed the Advanced Practitioner's note and chart, and I agree with the management and plan.  Donnamae Jude, MD [facility] Attending 10/09/2013 11:08 AM

## 2013-10-10 ENCOUNTER — Ambulatory Visit (INDEPENDENT_AMBULATORY_CARE_PROVIDER_SITE_OTHER): Payer: Medicaid Other | Admitting: *Deleted

## 2013-10-10 VITALS — BP 121/80 | HR 81 | Temp 97.8°F

## 2013-10-10 DIAGNOSIS — O09219 Supervision of pregnancy with history of pre-term labor, unspecified trimester: Secondary | ICD-10-CM

## 2013-10-10 DIAGNOSIS — O09899 Supervision of other high risk pregnancies, unspecified trimester: Secondary | ICD-10-CM

## 2013-10-10 NOTE — Progress Notes (Signed)
17P injection

## 2013-10-16 ENCOUNTER — Encounter: Payer: Self-pay | Admitting: Obstetrics and Gynecology

## 2013-10-16 ENCOUNTER — Ambulatory Visit (INDEPENDENT_AMBULATORY_CARE_PROVIDER_SITE_OTHER): Payer: Medicaid Other | Admitting: Obstetrics and Gynecology

## 2013-10-16 VITALS — BP 110/72 | HR 87 | Wt 235.9 lb

## 2013-10-16 DIAGNOSIS — O09219 Supervision of pregnancy with history of pre-term labor, unspecified trimester: Secondary | ICD-10-CM

## 2013-10-16 DIAGNOSIS — O09899 Supervision of other high risk pregnancies, unspecified trimester: Secondary | ICD-10-CM

## 2013-10-16 LAB — POCT URINALYSIS DIP (DEVICE)
BILIRUBIN URINE: NEGATIVE
Glucose, UA: NEGATIVE mg/dL
Hgb urine dipstick: NEGATIVE
Ketones, ur: NEGATIVE mg/dL
Nitrite: NEGATIVE
PROTEIN: NEGATIVE mg/dL
SPECIFIC GRAVITY, URINE: 1.025 (ref 1.005–1.030)
Urobilinogen, UA: 0.2 mg/dL (ref 0.0–1.0)
pH: 5.5 (ref 5.0–8.0)

## 2013-10-16 NOTE — MAU Provider Note (Signed)
Confirmed, note in chart at time of MAU visit.

## 2013-10-16 NOTE — Progress Notes (Signed)
Doing well. No PTL sx. 17-P given. Signed waterbirth consents.

## 2013-10-20 ENCOUNTER — Encounter (HOSPITAL_COMMUNITY): Payer: Self-pay

## 2013-10-23 ENCOUNTER — Ambulatory Visit (INDEPENDENT_AMBULATORY_CARE_PROVIDER_SITE_OTHER): Payer: Medicaid Other | Admitting: *Deleted

## 2013-10-23 DIAGNOSIS — O09213 Supervision of pregnancy with history of pre-term labor, third trimester: Principal | ICD-10-CM

## 2013-10-23 DIAGNOSIS — O09219 Supervision of pregnancy with history of pre-term labor, unspecified trimester: Secondary | ICD-10-CM

## 2013-10-23 DIAGNOSIS — O09893 Supervision of other high risk pregnancies, third trimester: Secondary | ICD-10-CM

## 2013-10-23 NOTE — Progress Notes (Signed)
17P injection given as ordered.  Pt tolerated well.

## 2013-10-28 ENCOUNTER — Encounter (HOSPITAL_COMMUNITY): Payer: Self-pay

## 2013-10-28 ENCOUNTER — Inpatient Hospital Stay (HOSPITAL_COMMUNITY)
Admission: AD | Admit: 2013-10-28 | Discharge: 2013-10-28 | Disposition: A | Discharge status: Home / Self Care | Payer: Medicaid Other | Source: Ambulatory Visit | Attending: Obstetrics & Gynecology | Admitting: Obstetrics & Gynecology

## 2013-10-28 DIAGNOSIS — O368190 Decreased fetal movements, unspecified trimester, not applicable or unspecified: Secondary | ICD-10-CM | POA: Insufficient documentation

## 2013-10-28 DIAGNOSIS — O99891 Other specified diseases and conditions complicating pregnancy: Secondary | ICD-10-CM | POA: Insufficient documentation

## 2013-10-28 DIAGNOSIS — O368139 Decreased fetal movements, third trimester, other fetus: Secondary | ICD-10-CM

## 2013-10-28 DIAGNOSIS — O9989 Other specified diseases and conditions complicating pregnancy, childbirth and the puerperium: Secondary | ICD-10-CM

## 2013-10-28 LAB — URINALYSIS, ROUTINE W REFLEX MICROSCOPIC
Bilirubin Urine: NEGATIVE
GLUCOSE, UA: NEGATIVE mg/dL
Hgb urine dipstick: NEGATIVE
Ketones, ur: NEGATIVE mg/dL
Leukocytes, UA: NEGATIVE
NITRITE: NEGATIVE
PH: 5.5 (ref 5.0–8.0)
Protein, ur: NEGATIVE mg/dL
Specific Gravity, Urine: 1.025 (ref 1.005–1.030)
Urobilinogen, UA: 0.2 mg/dL (ref 0.0–1.0)

## 2013-10-28 LAB — WET PREP, GENITAL
Trich, Wet Prep: NONE SEEN
YEAST WET PREP: NONE SEEN

## 2013-10-28 MED ORDER — METRONIDAZOLE 500 MG PO TABS: 500.0000 mg | ORAL_TABLET | Freq: Two times a day (BID) | ORAL | Status: DC

## 2013-10-28 NOTE — MAU Note (Signed)
PT SAYS SHE FELT BABY MOVE X2 TODAY,  THEN YESTERDAY  ONLY 2X .  SAYS SHE PASSED MUCUS PLUG ON Thursday-   AND BABY HAS MOVED LESS SINCE THEN .  ALSO   ON SAT-  SHE  NOTICED  HER PANTS WERE WET - 5-6 PM.  -   SINCE THEN  UNDERWEAR ARE MOIST.   GETS PNC- LAST SEEN -  LAST Thursday.  NEXT APPOINTMENT -  7-2.    DENIES  AND MRSA.   LAST SEX-  YESTERDAY.

## 2013-10-28 NOTE — Discharge Instructions (Signed)
Fetal Movement Counts Patient Name: __________________________________________________ Patient Due Date: ____________________ Performing a fetal movement count is highly recommended in high-risk pregnancies, but it is good for every pregnant woman to do. Your caregiver may ask you to start counting fetal movements at 28 weeks of the pregnancy. Fetal movements often increase:  After eating a full meal.  After physical activity.  After eating or drinking something sweet or cold.  At rest. Pay attention to when you feel the baby is most active. This will help you notice a pattern of your baby's sleep and wake cycles and what factors contribute to an increase in fetal movement. It is important to perform a fetal movement count at the same time each day when your baby is normally most active.  HOW TO COUNT FETAL MOVEMENTS 1. Find a quiet and comfortable area to sit or lie down on your left side. Lying on your left side provides the best blood and oxygen circulation to your baby. 2. Write down the day and time on a sheet of paper or in a journal. 3. Start counting kicks, flutters, swishes, rolls, or jabs in a 2 hour period. You should feel at least 10 movements within 2 hours. 4. If you do not feel 10 movements in 2 hours, wait 2-3 hours and count again. Look for a change in the pattern or not enough counts in 2 hours. SEEK MEDICAL CARE IF:  You feel less than 10 counts in 2 hours, tried twice.  There is no movement in over an hour.  The pattern is changing or taking longer each day to reach 10 counts in 2 hours.  You feel the baby is not moving as he or she usually does. Date: ____________ Movements: ____________ Start time: ____________ Elizebeth Koller time: ____________  Date: ____________ Movements: ____________ Start time: ____________ Elizebeth Koller time: ____________ Date: ____________ Movements: ____________ Start time: ____________ Elizebeth Koller time: ____________ Date: ____________ Movements: ____________  Start time: ____________ Elizebeth Koller time: ____________ Date: ____________ Movements: ____________ Start time: ____________ Elizebeth Koller time: ____________ Date: ____________ Movements: ____________ Start time: ____________ Elizebeth Koller time: ____________ Date: ____________ Movements: ____________ Start time: ____________ Elizebeth Koller time: ____________ Date: ____________ Movements: ____________ Start time: ____________ Elizebeth Koller time: ____________  Date: ____________ Movements: ____________ Start time: ____________ Elizebeth Koller time: ____________ Date: ____________ Movements: ____________ Start time: ____________ Elizebeth Koller time: ____________ Date: ____________ Movements: ____________ Start time: ____________ Elizebeth Koller time: ____________ Date: ____________ Movements: ____________ Start time: ____________ Elizebeth Koller time: ____________ Date: ____________ Movements: ____________ Start time: ____________ Elizebeth Koller time: ____________ Date: ____________ Movements: ____________ Start time: ____________ Elizebeth Koller time: ____________ Date: ____________ Movements: ____________ Start time: ____________ Elizebeth Koller time: ____________  Date: ____________ Movements: ____________ Start time: ____________ Elizebeth Koller time: ____________ Date: ____________ Movements: ____________ Start time: ____________ Elizebeth Koller time: ____________ Date: ____________ Movements: ____________ Start time: ____________ Elizebeth Koller time: ____________ Date: ____________ Movements: ____________ Start time: ____________ Elizebeth Koller time: ____________ Date: ____________ Movements: ____________ Start time: ____________ Elizebeth Koller time: ____________ Date: ____________ Movements: ____________ Start time: ____________ Elizebeth Koller time: ____________ Date: ____________ Movements: ____________ Start time: ____________ Elizebeth Koller time: ____________  Date: ____________ Movements: ____________ Start time: ____________ Elizebeth Koller time: ____________ Date: ____________ Movements: ____________ Start time: ____________ Elizebeth Koller time:  ____________ Date: ____________ Movements: ____________ Start time: ____________ Elizebeth Koller time: ____________ Date: ____________ Movements: ____________ Start time: ____________ Elizebeth Koller time: ____________ Date: ____________ Movements: ____________ Start time: ____________ Elizebeth Koller time: ____________ Date: ____________ Movements: ____________ Start time: ____________ Elizebeth Koller time: ____________ Date: ____________ Movements: ____________ Start time: ____________ Elizebeth Koller time: ____________  Date: ____________ Movements: ____________ Start time: ____________ Elizebeth Koller time:  ____________ Date: ____________ Movements: ____________ Start time: ____________ Elizebeth Koller time: ____________ Date: ____________ Movements: ____________ Start time: ____________ Elizebeth Koller time: ____________ Date: ____________ Movements: ____________ Start time: ____________ Elizebeth Koller time: ____________ Date: ____________ Movements: ____________ Start time: ____________ Elizebeth Koller time: ____________ Date: ____________ Movements: ____________ Start time: ____________ Elizebeth Koller time: ____________ Date: ____________ Movements: ____________ Start time: ____________ Elizebeth Koller time: ____________  Date: ____________ Movements: ____________ Start time: ____________ Elizebeth Koller time: ____________ Date: ____________ Movements: ____________ Start time: ____________ Elizebeth Koller time: ____________ Date: ____________ Movements: ____________ Start time: ____________ Elizebeth Koller time: ____________ Date: ____________ Movements: ____________ Start time: ____________ Elizebeth Koller time: ____________ Date: ____________ Movements: ____________ Start time: ____________ Elizebeth Koller time: ____________ Date: ____________ Movements: ____________ Start time: ____________ Elizebeth Koller time: ____________ Date: ____________ Movements: ____________ Start time: ____________ Elizebeth Koller time: ____________  Date: ____________ Movements: ____________ Start time: ____________ Elizebeth Koller time: ____________ Date: ____________ Movements:  ____________ Start time: ____________ Elizebeth Koller time: ____________ Date: ____________ Movements: ____________ Start time: ____________ Elizebeth Koller time: ____________ Date: ____________ Movements: ____________ Start time: ____________ Elizebeth Koller time: ____________ Date: ____________ Movements: ____________ Start time: ____________ Elizebeth Koller time: ____________ Date: ____________ Movements: ____________ Start time: ____________ Elizebeth Koller time: ____________ Date: ____________ Movements: ____________ Start time: ____________ Elizebeth Koller time: ____________  Date: ____________ Movements: ____________ Start time: ____________ Elizebeth Koller time: ____________ Date: ____________ Movements: ____________ Start time: ____________ Elizebeth Koller time: ____________ Date: ____________ Movements: ____________ Start time: ____________ Elizebeth Koller time: ____________ Date: ____________ Movements: ____________ Start time: ____________ Elizebeth Koller time: ____________ Date: ____________ Movements: ____________ Start time: ____________ Elizebeth Koller time: ____________ Date: ____________ Movements: ____________ Start time: ____________ Elizebeth Koller time: ____________ Document Released: 05/17/2006 Document Revised: 04/03/2012 Document Reviewed: 02/12/2012 [employer] Patient Information 2015 [place]. This information is not intended to replace advice given to you by your health care provider. Make sure you discuss any questions you have with your health care provider.

## 2013-10-28 NOTE — MAU Provider Note (Signed)
None     Chief Complaint:  No chief complaint on file.   DAUNE DIVIRGILIO is  28 y.o. [id number] at [redacted]w[redacted]d presents complaining of no fetal movement over the last 2 days. Pt lost mucous plug on Thursday, and then felt her pants were wet and since then she has had to change her underwear periodically thoughout the days. Intercourse yesterday. Pt seen in clinic last Thursday.   No vb, no ctx. ?LOF and Decreased fetal movement   Obstetrical/Gynecological History: OB History   Grav Para Term Preterm Abortions TAB SAB Ect Mult Living   4 2 1 1 1  0 1 0 0 2     Past Medical History: Past Medical History  Diagnosis Date  . Hypertension   . Cervical cancer   . Abnormal Pap smear 2000    cervical - laser treatment  . Pregnancy induced hypertension   . Headache(784.0)   . Urinary tract infection   . Trichomonas     Past Surgical History: Past Surgical History  Procedure Laterality Date  . Laser ablation condyloma cervical / vulvar      Family History: Family History  Problem Relation Age of Onset  . Hypertension Mother   . Diabetes Mother   . Diabetes Father   . Hypertension Father   . Other Neg Hx     Social History: History  Substance Use Topics  . Smoking status: Never Smoker   . Smokeless tobacco: Never Used  . Alcohol Use: No    Allergies:  Allergies  Allergen Reactions  . Other Other (See Comments)    Pt tends to get yeast infections from antibiotics. She can take antibiotics as long as she is given fluconazole.    Meds:  Facility-administered medications prior to admission  Medication Dose Route Frequency Provider Last Rate Last Dose  . hydroxyprogesterone caproate (DELALUTIN) 250 mg/mL injection 250 mg  250 mg Intramuscular Weekly Allen Norris, MD   250 mg at 10/23/13 1100   Prescriptions prior to admission  Medication Sig Dispense Refill  . Prenatal Vit-Fe Fumarate-FA (PRENATAL MULTIVITAMIN) TABS tablet Take 1 tablet by mouth at bedtime.         Review of Systems -   Review of Systems  See HPI    Physical Exam  Blood pressure 113/71, pulse 71, temperature 98.5 F (36.9 C), temperature source Oral, resp. rate 18, height 5\' 6"  (1.676 m), weight 112.038 kg (247 lb), last menstrual period 02/21/2013. GENERAL: Well-developed, well-nourished female in no acute distress.  ABDOMEN: Soft, nontender, nondistended, gravid.  EXTREMITIES: Nontender, no edema, 2+ distal pulses. DTR's 2+ Dilation: Fingertip Effacement (%): Thick Cervical Position: Posterior Presentation: Vertex Exam by:: Rodrick Payson MD  SSE:  SSE with white discharge and closed cervix Neg Ferning,  Presentation: cephalic FHT:  Baseline rate 130s bpm   Variability moderate  Accelerations present   Decelerations none Contractions: NOne  Limited 3rd Trimester: SIUP, VTX, FHT 133, Ant Placenta, AFI 10.8   Labs: Results for orders placed during the hospital encounter of 10/28/13 (from the past 24 hour(s))  URINALYSIS, ROUTINE W REFLEX MICROSCOPIC   Collection Time    10/28/13 12:15 AM      Result Value Ref Range   Color, Urine YELLOW  YELLOW   APPearance CLEAR  CLEAR   Specific Gravity, Urine 1.025  1.005 - 1.030   pH 5.5  5.0 - 8.0   Glucose, UA NEGATIVE  NEGATIVE mg/dL   Hgb urine dipstick NEGATIVE  NEGATIVE   Bilirubin  Urine NEGATIVE  NEGATIVE   Ketones, ur NEGATIVE  NEGATIVE mg/dL   Protein, ur NEGATIVE  NEGATIVE mg/dL   Urobilinogen, UA 0.2  0.0 - 1.0 mg/dL   Nitrite NEGATIVE  NEGATIVE   Leukocytes, UA NEGATIVE  NEGATIVE  WET PREP, GENITAL   Collection Time    10/28/13  1:00 AM      Result Value Ref Range   Yeast Wet Prep HPF POC NONE SEEN  NONE SEEN   Trich, Wet Prep NONE SEEN  NONE SEEN   Clue Cells Wet Prep HPF POC FEW (*) NONE SEEN   WBC, Wet Prep HPF POC MODERATE (*) NONE SEEN   Imaging Studies:  No results found.  Assessment: RHYA SHAN is  28 y.o. [phone] at [redacted]w[redacted]d presents with decreased fetal movement and ?LOF  #Decreased FM:  Reassuring Modified BPP, Pt given reassurance. Follow up on Thursday 2July  #leakage of fluid: Neg Ferning, SSE with white discharge and closed cervix and normal AFI. Unlikely ruptured. Discharge home.  #Clue CellsL Will tx emperically for BV with falgyl 500mg  BID x7d  Sarthak Rubenstein RYAN 6/30/20152:25 AM

## 2013-10-30 ENCOUNTER — Ambulatory Visit (INDEPENDENT_AMBULATORY_CARE_PROVIDER_SITE_OTHER): Payer: Medicaid Other | Admitting: Obstetrics & Gynecology

## 2013-10-30 VITALS — BP 117/77 | HR 86 | Wt 242.6 lb

## 2013-10-30 DIAGNOSIS — O09893 Supervision of other high risk pregnancies, third trimester: Secondary | ICD-10-CM

## 2013-10-30 DIAGNOSIS — O09213 Supervision of pregnancy with history of pre-term labor, third trimester: Principal | ICD-10-CM

## 2013-10-30 DIAGNOSIS — O09219 Supervision of pregnancy with history of pre-term labor, unspecified trimester: Secondary | ICD-10-CM

## 2013-10-30 DIAGNOSIS — O99321 Drug use complicating pregnancy, first trimester: Secondary | ICD-10-CM

## 2013-10-30 LAB — OB RESULTS CONSOLE GC/CHLAMYDIA
Chlamydia: NEGATIVE
Gonorrhea: NEGATIVE

## 2013-10-30 LAB — OB RESULTS CONSOLE GBS: GBS: POSITIVE

## 2013-10-30 NOTE — Patient Instructions (Signed)
Return to clinic for any obstetric concerns or go to MAU for evaluation  

## 2013-10-30 NOTE — Progress Notes (Signed)
Thinks her mucus plug came out.

## 2013-10-30 NOTE — Progress Notes (Signed)
Pelvic cultures done today.  Continue weekly 17P, last one will be next week.  No other complaints or concerns.  Fetal movement and labor precautions reviewed.

## 2013-11-01 LAB — GC/CHLAMYDIA PROBE AMP
CT Probe RNA: NEGATIVE
GC Probe RNA: NEGATIVE

## 2013-11-02 LAB — CULTURE, BETA STREP (GROUP B ONLY)

## 2013-11-03 ENCOUNTER — Encounter: Payer: Self-pay | Admitting: Obstetrics & Gynecology

## 2013-11-03 DIAGNOSIS — O99820 Streptococcus B carrier state complicating pregnancy: Secondary | ICD-10-CM | POA: Insufficient documentation

## 2013-11-06 ENCOUNTER — Ambulatory Visit: Payer: Medicaid Other

## 2013-11-06 ENCOUNTER — Encounter: Payer: Self-pay | Admitting: *Deleted

## 2013-11-13 ENCOUNTER — Ambulatory Visit (INDEPENDENT_AMBULATORY_CARE_PROVIDER_SITE_OTHER): Payer: Medicaid Other | Admitting: Family

## 2013-11-13 VITALS — BP 115/79 | HR 78 | Wt 249.5 lb

## 2013-11-13 DIAGNOSIS — O09219 Supervision of pregnancy with history of pre-term labor, unspecified trimester: Secondary | ICD-10-CM

## 2013-11-13 DIAGNOSIS — O09213 Supervision of pregnancy with history of pre-term labor, third trimester: Principal | ICD-10-CM

## 2013-11-13 DIAGNOSIS — O09893 Supervision of other high risk pregnancies, third trimester: Secondary | ICD-10-CM

## 2013-11-13 NOTE — Progress Notes (Signed)
No questions or concerns.  Reviewed GBS status and need for antibiotics in labor.  Reviewed contraindications of waterbirth if developed later in pregnancy including the following:   thick, particulate meconium stained fluid, Maternal fever over 101, heavy bleeding or signs of placental abruption, pre-eclampsia, abnormal fetal heart rate pattern, breech presentation, very large baby, active communicable infection (this does NOT include group B strep), significant limitation to mobility.  Dental letter given.  Urine results not available at discharge.

## 2013-11-17 ENCOUNTER — Inpatient Hospital Stay (HOSPITAL_COMMUNITY)
Admission: AD | Admit: 2013-11-17 | Discharge: 2013-11-17 | Disposition: A | Discharge status: Home / Self Care | Payer: Medicaid Other | Source: Ambulatory Visit | Attending: Obstetrics & Gynecology | Admitting: Obstetrics & Gynecology

## 2013-11-17 ENCOUNTER — Encounter (HOSPITAL_COMMUNITY): Payer: Self-pay | Admitting: *Deleted

## 2013-11-17 DIAGNOSIS — O479 False labor, unspecified: Secondary | ICD-10-CM | POA: Insufficient documentation

## 2013-11-17 DIAGNOSIS — O09213 Supervision of pregnancy with history of pre-term labor, third trimester: Secondary | ICD-10-CM

## 2013-11-17 DIAGNOSIS — O99321 Drug use complicating pregnancy, first trimester: Secondary | ICD-10-CM

## 2013-11-17 DIAGNOSIS — O99820 Streptococcus B carrier state complicating pregnancy: Secondary | ICD-10-CM

## 2013-11-17 DIAGNOSIS — O09893 Supervision of other high risk pregnancies, third trimester: Secondary | ICD-10-CM

## 2013-11-17 NOTE — Discharge Instructions (Signed)
Fetal Movement Counts Patient Name: __________________________________________________ Patient Due Date: ____________________ Performing a fetal movement count is highly recommended in high-risk pregnancies, but it is good for every pregnant woman to do. Your caregiver may ask you to start counting fetal movements at 28 weeks of the pregnancy. Fetal movements often increase:  After eating a full meal.  After physical activity.  After eating or drinking something sweet or cold.  At rest. Pay attention to when you feel the baby is most active. This will help you notice a pattern of your baby's sleep and wake cycles and what factors contribute to an increase in fetal movement. It is important to perform a fetal movement count at the same time each day when your baby is normally most active.  HOW TO COUNT FETAL MOVEMENTS 1. Find a quiet and comfortable area to sit or lie down on your left side. Lying on your left side provides the best blood and oxygen circulation to your baby. 2. Write down the day and time on a sheet of paper or in a journal. 3. Start counting kicks, flutters, swishes, rolls, or jabs in a 2 hour period. You should feel at least 10 movements within 2 hours. 4. If you do not feel 10 movements in 2 hours, wait 2-3 hours and count again. Look for a change in the pattern or not enough counts in 2 hours. SEEK MEDICAL CARE IF:  You feel less than 10 counts in 2 hours, tried twice.  There is no movement in over an hour.  The pattern is changing or taking longer each day to reach 10 counts in 2 hours.  You feel the baby is not moving as he or she usually does. Date: ____________ Movements: ____________ Start time: ____________ Elizebeth Koller time: ____________  Date: ____________ Movements: ____________ Start time: ____________ Elizebeth Koller time: ____________ Date: ____________ Movements: ____________ Start time: ____________ Elizebeth Koller time: ____________ Date: ____________ Movements: ____________  Start time: ____________ Elizebeth Koller time: ____________ Date: ____________ Movements: ____________ Start time: ____________ Elizebeth Koller time: ____________ Date: ____________ Movements: ____________ Start time: ____________ Elizebeth Koller time: ____________ Date: ____________ Movements: ____________ Start time: ____________ Elizebeth Koller time: ____________ Date: ____________ Movements: ____________ Start time: ____________ Elizebeth Koller time: ____________  Date: ____________ Movements: ____________ Start time: ____________ Elizebeth Koller time: ____________ Date: ____________ Movements: ____________ Start time: ____________ Elizebeth Koller time: ____________ Date: ____________ Movements: ____________ Start time: ____________ Elizebeth Koller time: ____________ Date: ____________ Movements: ____________ Start time: ____________ Elizebeth Koller time: ____________ Date: ____________ Movements: ____________ Start time: ____________ Elizebeth Koller time: ____________ Date: ____________ Movements: ____________ Start time: ____________ Elizebeth Koller time: ____________ Date: ____________ Movements: ____________ Start time: ____________ Elizebeth Koller time: ____________  Date: ____________ Movements: ____________ Start time: ____________ Elizebeth Koller time: ____________ Date: ____________ Movements: ____________ Start time: ____________ Elizebeth Koller time: ____________ Date: ____________ Movements: ____________ Start time: ____________ Elizebeth Koller time: ____________ Date: ____________ Movements: ____________ Start time: ____________ Elizebeth Koller time: ____________ Date: ____________ Movements: ____________ Start time: ____________ Elizebeth Koller time: ____________ Date: ____________ Movements: ____________ Start time: ____________ Elizebeth Koller time: ____________ Date: ____________ Movements: ____________ Start time: ____________ Elizebeth Koller time: ____________  Date: ____________ Movements: ____________ Start time: ____________ Elizebeth Koller time: ____________ Date: ____________ Movements: ____________ Start time: ____________ Elizebeth Koller time:  ____________ Date: ____________ Movements: ____________ Start time: ____________ Elizebeth Koller time: ____________ Date: ____________ Movements: ____________ Start time: ____________ Elizebeth Koller time: ____________ Date: ____________ Movements: ____________ Start time: ____________ Elizebeth Koller time: ____________ Date: ____________ Movements: ____________ Start time: ____________ Elizebeth Koller time: ____________ Date: ____________ Movements: ____________ Start time: ____________ Elizebeth Koller time: ____________  Date: ____________ Movements: ____________ Start time: ____________ Elizebeth Koller time:  ____________ Date: ____________ Movements: ____________ Start time: ____________ Elizebeth Koller time: ____________ Date: ____________ Movements: ____________ Start time: ____________ Elizebeth Koller time: ____________ Date: ____________ Movements: ____________ Start time: ____________ Elizebeth Koller time: ____________ Date: ____________ Movements: ____________ Start time: ____________ Elizebeth Koller time: ____________ Date: ____________ Movements: ____________ Start time: ____________ Elizebeth Koller time: ____________ Date: ____________ Movements: ____________ Start time: ____________ Elizebeth Koller time: ____________  Date: ____________ Movements: ____________ Start time: ____________ Elizebeth Koller time: ____________ Date: ____________ Movements: ____________ Start time: ____________ Elizebeth Koller time: ____________ Date: ____________ Movements: ____________ Start time: ____________ Elizebeth Koller time: ____________ Date: ____________ Movements: ____________ Start time: ____________ Elizebeth Koller time: ____________ Date: ____________ Movements: ____________ Start time: ____________ Elizebeth Koller time: ____________ Date: ____________ Movements: ____________ Start time: ____________ Elizebeth Koller time: ____________ Date: ____________ Movements: ____________ Start time: ____________ Elizebeth Koller time: ____________  Date: ____________ Movements: ____________ Start time: ____________ Elizebeth Koller time: ____________ Date: ____________ Movements:  ____________ Start time: ____________ Elizebeth Koller time: ____________ Date: ____________ Movements: ____________ Start time: ____________ Elizebeth Koller time: ____________ Date: ____________ Movements: ____________ Start time: ____________ Elizebeth Koller time: ____________ Date: ____________ Movements: ____________ Start time: ____________ Elizebeth Koller time: ____________ Date: ____________ Movements: ____________ Start time: ____________ Elizebeth Koller time: ____________ Date: ____________ Movements: ____________ Start time: ____________ Elizebeth Koller time: ____________  Date: ____________ Movements: ____________ Start time: ____________ Elizebeth Koller time: ____________ Date: ____________ Movements: ____________ Start time: ____________ Elizebeth Koller time: ____________ Date: ____________ Movements: ____________ Start time: ____________ Elizebeth Koller time: ____________ Date: ____________ Movements: ____________ Start time: ____________ Elizebeth Koller time: ____________ Date: ____________ Movements: ____________ Start time: ____________ Elizebeth Koller time: ____________ Date: ____________ Movements: ____________ Start time: ____________ Elizebeth Koller time: ____________ Document Released: 05/17/2006 Document Revised: 04/03/2012 Document Reviewed: 02/12/2012 Exit[employer] Patient Information 2015 [employer]. This information is not intended to replace advice given to you by your health care provider. Make sure you discuss any questions you have with your health care provider. Braxton Hicks Contractions Contractions of the uterus can occur throughout pregnancy. Contractions are not always a sign that you are in labor.  WHAT ARE BRAXTON HICKS CONTRACTIONS?  Contractions that occur before labor are called Braxton Hicks contractions, or false labor. Toward the end of pregnancy (32-34 weeks), these contractions can develop more often and may become more forceful. This is not true labor because these contractions do not result in opening (dilatation) and thinning of the cervix. They are  sometimes difficult to tell apart from true labor because these contractions can be forceful and people have different pain tolerances. You should not feel embarrassed if you go to the hospital with false labor. Sometimes, the only way to tell if you are in true labor is for your health care provider to look for changes in the cervix. If there are no prenatal problems or other health problems associated with the pregnancy, it is completely safe to be sent home with false labor and await the onset of true labor. HOW CAN YOU TELL THE DIFFERENCE BETWEEN TRUE AND FALSE LABOR? False Labor  The contractions of false labor are usually shorter and not as hard as those of true labor.   The contractions are usually irregular.   The contractions are often felt in the front of the lower abdomen and in the groin.   The contractions may go away when you walk around or change positions while lying down.   The contractions get weaker and are shorter lasting as time goes on.   The contractions do not usually become progressively stronger, regular, and closer together as with true labor.  True Labor  Contractions in true labor last 30-70 seconds, become very  regular, usually become more intense, and increase in frequency.   The contractions do not go away with walking.   The discomfort is usually felt in the top of the uterus and spreads to the lower abdomen and low back.   True labor can be determined by your health care provider with an exam. This will show that the cervix is dilating and getting thinner.  WHAT TO REMEMBER  Keep up with your usual exercises and follow other instructions given by your health care provider.   Take medicines as directed by your health care provider.   Keep your regular prenatal appointments.   Eat and drink lightly if you think you are going into labor.   If Braxton Hicks contractions are making you uncomfortable:   Change your position from lying  down or resting to walking, or from walking to resting.   Sit and rest in a tub of warm water.   Drink 2-3 glasses of water. Dehydration may cause these contractions.   Do slow and deep breathing several times an hour.  WHEN SHOULD I SEEK IMMEDIATE MEDICAL CARE? Seek immediate medical care if:  Your contractions become stronger, more regular, and closer together.   You have fluid leaking or gushing from your vagina.   You have a fever.   You pass blood-tinged mucus.   You have vaginal bleeding.   You have continuous abdominal pain.   You have low back pain that you never had before.   You feel your baby's head pushing down and causing pelvic pressure.   Your baby is not moving as much as it used to.  Document Released: 04/17/2005 Document Revised: 04/22/2013 Document Reviewed: 01/27/2013 [facility] Patient Information 2015 [place], [place]. This information is not intended to replace advice given to you by your health care provider. Make sure you discuss any questions you have with your health care provider.

## 2013-11-17 NOTE — MAU Note (Signed)
Pt reports contractions since yesterday. Denies leaking of fluid or vaginal bleeding.

## 2013-11-17 NOTE — Progress Notes (Signed)
H Hogan made aware of FHT's and cervical exam. Plan is to monitor pt until 7:05. D/c home w/ reactive tracing.

## 2013-11-20 ENCOUNTER — Ambulatory Visit (INDEPENDENT_AMBULATORY_CARE_PROVIDER_SITE_OTHER): Payer: Medicaid Other | Admitting: Family Medicine

## 2013-11-20 VITALS — BP 112/72 | HR 68 | Temp 98.1°F | Wt 252.6 lb

## 2013-11-20 DIAGNOSIS — O09219 Supervision of pregnancy with history of pre-term labor, unspecified trimester: Secondary | ICD-10-CM

## 2013-11-20 DIAGNOSIS — O09213 Supervision of pregnancy with history of pre-term labor, third trimester: Principal | ICD-10-CM

## 2013-11-20 DIAGNOSIS — O09893 Supervision of other high risk pregnancies, third trimester: Secondary | ICD-10-CM

## 2013-11-20 NOTE — Progress Notes (Signed)
Pt complains of contractions yesterday/all last night,  Pt request cervical exam.  Pt is concerned about swelling in legs.

## 2013-11-20 NOTE — Progress Notes (Signed)
Patient is 28 y.o. [id number] 29w6dby LMP cw early sono.  +FM, denies LOF, VB, contractions, vaginal discharge.   ==> +LE edema, left worse than right (baseline, 1+).  Overall feeling well. - needs to get kit for water birth, precautions discussed at last visit

## 2013-11-24 ENCOUNTER — Encounter (HOSPITAL_COMMUNITY): Payer: Self-pay | Admitting: *Deleted

## 2013-11-24 ENCOUNTER — Inpatient Hospital Stay (HOSPITAL_COMMUNITY)
Admission: AD | Admit: 2013-11-24 | Discharge: 2013-11-26 | DRG: 767 | Disposition: A | Discharge status: Home / Self Care | Payer: Medicaid Other | Source: Ambulatory Visit | Attending: Obstetrics & Gynecology | Admitting: Obstetrics & Gynecology

## 2013-11-24 DIAGNOSIS — Z6841 Body Mass Index (BMI) 40.0 and over, adult: Secondary | ICD-10-CM | POA: Diagnosis not present

## 2013-11-24 DIAGNOSIS — Z302 Encounter for sterilization: Secondary | ICD-10-CM

## 2013-11-24 DIAGNOSIS — O9989 Other specified diseases and conditions complicating pregnancy, childbirth and the puerperium: Secondary | ICD-10-CM

## 2013-11-24 DIAGNOSIS — O99214 Obesity complicating childbirth: Secondary | ICD-10-CM

## 2013-11-24 DIAGNOSIS — O0940 Supervision of pregnancy with grand multiparity, unspecified trimester: Secondary | ICD-10-CM

## 2013-11-24 DIAGNOSIS — Z22330 Carrier of Group B streptococcus: Secondary | ICD-10-CM

## 2013-11-24 DIAGNOSIS — O479 False labor, unspecified: Secondary | ICD-10-CM | POA: Diagnosis present

## 2013-11-24 DIAGNOSIS — O99820 Streptococcus B carrier state complicating pregnancy: Secondary | ICD-10-CM

## 2013-11-24 DIAGNOSIS — Z833 Family history of diabetes mellitus: Secondary | ICD-10-CM

## 2013-11-24 DIAGNOSIS — IMO0001 Reserved for inherently not codable concepts without codable children: Secondary | ICD-10-CM

## 2013-11-24 DIAGNOSIS — Z8249 Family history of ischemic heart disease and other diseases of the circulatory system: Secondary | ICD-10-CM | POA: Diagnosis not present

## 2013-11-24 DIAGNOSIS — Z8541 Personal history of malignant neoplasm of cervix uteri: Secondary | ICD-10-CM | POA: Diagnosis not present

## 2013-11-24 DIAGNOSIS — O99892 Other specified diseases and conditions complicating childbirth: Secondary | ICD-10-CM | POA: Diagnosis present

## 2013-11-24 DIAGNOSIS — O1002 Pre-existing essential hypertension complicating childbirth: Principal | ICD-10-CM | POA: Diagnosis present

## 2013-11-24 DIAGNOSIS — E669 Obesity, unspecified: Secondary | ICD-10-CM

## 2013-11-24 DIAGNOSIS — O99321 Drug use complicating pregnancy, first trimester: Secondary | ICD-10-CM

## 2013-11-24 LAB — CBC
HCT: 34.7 % — ABNORMAL LOW (ref 36.0–46.0)
Hemoglobin: 12 g/dL (ref 12.0–15.0)
MCH: 32.8 pg (ref 26.0–34.0)
MCHC: 34.6 g/dL (ref 30.0–36.0)
MCV: 94.8 fL (ref 78.0–100.0)
Platelets: 175 10*3/uL (ref 150–400)
RBC: 3.66 MIL/uL — ABNORMAL LOW (ref 3.87–5.11)
RDW: 13.1 % (ref 11.5–15.5)
WBC: 8.5 10*3/uL (ref 4.0–10.5)

## 2013-11-24 LAB — RPR

## 2013-11-24 LAB — RAPID URINE DRUG SCREEN, HOSP PERFORMED
Amphetamines: NOT DETECTED
Barbiturates: NOT DETECTED
Benzodiazepines: NOT DETECTED
Cocaine: NOT DETECTED
OPIATES: NOT DETECTED
Tetrahydrocannabinol: NOT DETECTED

## 2013-11-24 LAB — SURGICAL PCR SCREEN
MRSA, PCR: NEGATIVE
STAPHYLOCOCCUS AUREUS: NEGATIVE

## 2013-11-24 LAB — TYPE AND SCREEN
ABO/RH(D): A POS
Antibody Screen: NEGATIVE

## 2013-11-24 MED ORDER — LANOLIN HYDROUS EX OINT: TOPICAL_OINTMENT | CUTANEOUS | Status: DC | PRN

## 2013-11-24 MED ORDER — LACTATED RINGERS IV SOLN
INTRAVENOUS | Status: DC
  Administered 2013-11-24: 10:00:00 via INTRAVENOUS

## 2013-11-24 MED ORDER — PENICILLIN G POTASSIUM 5000000 UNITS IJ SOLR
2.5000 10*6.[IU] | INTRAMUSCULAR | Status: DC
  Administered 2013-11-24: 2.5 10*6.[IU] via INTRAVENOUS
  Filled 2013-11-24 (×5): qty 2.5

## 2013-11-24 MED ORDER — OXYTOCIN 40 UNITS IN LACTATED RINGERS INFUSION - SIMPLE MED: 62.5000 mL/h | INTRAVENOUS | Status: DC

## 2013-11-24 MED ORDER — FLEET ENEMA 7-19 GM/118ML RE ENEM: 1.0000 | ENEMA | RECTAL | Status: DC | PRN

## 2013-11-24 MED ORDER — OXYTOCIN BOLUS FROM INFUSION
500.0000 mL | INTRAVENOUS | Status: DC
  Administered 2013-11-24: 500 mL via INTRAVENOUS

## 2013-11-24 MED ORDER — LACTATED RINGERS IV SOLN
500.0000 mL | INTRAVENOUS | Status: DC | PRN
  Administered 2013-11-24: 300 mL via INTRAVENOUS

## 2013-11-24 MED ORDER — TETANUS-DIPHTH-ACELL PERTUSSIS 5-2.5-18.5 LF-MCG/0.5 IM SUSP: 0.5000 mL | Freq: Once | INTRAMUSCULAR | Status: DC

## 2013-11-24 MED ORDER — SENNOSIDES-DOCUSATE SODIUM 8.6-50 MG PO TABS
2.0000 | ORAL_TABLET | ORAL | Status: DC
  Administered 2013-11-24 – 2013-11-25 (×2): 2 via ORAL
  Filled 2013-11-24 (×2): qty 2

## 2013-11-24 MED ORDER — PENICILLIN G POTASSIUM 5000000 UNITS IJ SOLR: 5.0000 10*6.[IU] | Freq: Once | INTRAVENOUS | Status: DC

## 2013-11-24 MED ORDER — CITRIC ACID-SODIUM CITRATE 334-500 MG/5ML PO SOLN: 30.0000 mL | ORAL | Status: DC | PRN

## 2013-11-24 MED ORDER — OXYTOCIN BOLUS FROM INFUSION: 500.0000 mL | INTRAVENOUS | Status: DC

## 2013-11-24 MED ORDER — IBUPROFEN 600 MG PO TABS
600.0000 mg | ORAL_TABLET | Freq: Four times a day (QID) | ORAL | Status: DC | PRN
  Administered 2013-11-24: 600 mg via ORAL
  Filled 2013-11-24: qty 1

## 2013-11-24 MED ORDER — ONDANSETRON HCL 4 MG/2ML IJ SOLN: 4.0000 mg | INTRAMUSCULAR | Status: DC | PRN

## 2013-11-24 MED ORDER — ONDANSETRON HCL 4 MG/2ML IJ SOLN: 4.0000 mg | Freq: Four times a day (QID) | INTRAMUSCULAR | Status: DC | PRN

## 2013-11-24 MED ORDER — PENICILLIN G POTASSIUM 5000000 UNITS IJ SOLR
5.0000 10*6.[IU] | Freq: Once | INTRAVENOUS | Status: AC
  Administered 2013-11-24: 5 10*6.[IU] via INTRAVENOUS
  Filled 2013-11-24: qty 5

## 2013-11-24 MED ORDER — DIPHENHYDRAMINE HCL 25 MG PO CAPS: 25.0000 mg | ORAL_CAPSULE | Freq: Four times a day (QID) | ORAL | Status: DC | PRN

## 2013-11-24 MED ORDER — LACTATED RINGERS IV SOLN: INTRAVENOUS | Status: DC

## 2013-11-24 MED ORDER — SIMETHICONE 80 MG PO CHEW: 80.0000 mg | CHEWABLE_TABLET | ORAL | Status: DC | PRN

## 2013-11-24 MED ORDER — DEXTROSE 5 % IV SOLN: 2.5000 10*6.[IU] | INTRAVENOUS | Status: DC

## 2013-11-24 MED ORDER — WITCH HAZEL-GLYCERIN EX PADS: 1.0000 "application " | MEDICATED_PAD | CUTANEOUS | Status: DC | PRN

## 2013-11-24 MED ORDER — OXYTOCIN 40 UNITS IN LACTATED RINGERS INFUSION - SIMPLE MED
62.5000 mL/h | INTRAVENOUS | Status: DC
  Filled 2013-11-24: qty 1000

## 2013-11-24 MED ORDER — PRENATAL MULTIVITAMIN CH
1.0000 | ORAL_TABLET | Freq: Every day | ORAL | Status: DC
  Administered 2013-11-26: 1 via ORAL
  Filled 2013-11-24: qty 1

## 2013-11-24 MED ORDER — IBUPROFEN 600 MG PO TABS
600.0000 mg | ORAL_TABLET | Freq: Four times a day (QID) | ORAL | Status: DC
  Administered 2013-11-24 – 2013-11-26 (×7): 600 mg via ORAL
  Filled 2013-11-24 (×7): qty 1

## 2013-11-24 MED ORDER — FENTANYL CITRATE 0.05 MG/ML IJ SOLN
100.0000 ug | INTRAMUSCULAR | Status: DC | PRN
  Administered 2013-11-24 (×3): 100 ug via INTRAVENOUS
  Filled 2013-11-24 (×3): qty 2

## 2013-11-24 MED ORDER — LIDOCAINE HCL (PF) 1 % IJ SOLN
30.0000 mL | INTRAMUSCULAR | Status: DC | PRN
  Filled 2013-11-24: qty 30

## 2013-11-24 MED ORDER — ACETAMINOPHEN 325 MG PO TABS
650.0000 mg | ORAL_TABLET | ORAL | Status: DC | PRN
Start: 2013-11-24 — End: 2013-11-24

## 2013-11-24 MED ORDER — ONDANSETRON HCL 4 MG PO TABS: 4.0000 mg | ORAL_TABLET | ORAL | Status: DC | PRN

## 2013-11-24 MED ORDER — BENZOCAINE-MENTHOL 20-0.5 % EX AERO
1.0000 "application " | INHALATION_SPRAY | CUTANEOUS | Status: DC | PRN
  Filled 2013-11-24 (×2): qty 56

## 2013-11-24 MED ORDER — LACTATED RINGERS IV SOLN: 500.0000 mL | INTRAVENOUS | Status: DC | PRN

## 2013-11-24 MED ORDER — ZOLPIDEM TARTRATE 5 MG PO TABS: 5.0000 mg | ORAL_TABLET | Freq: Every evening | ORAL | Status: DC | PRN

## 2013-11-24 MED ORDER — IBUPROFEN 600 MG PO TABS: 600.0000 mg | ORAL_TABLET | Freq: Four times a day (QID) | ORAL | Status: DC | PRN

## 2013-11-24 MED ORDER — OXYCODONE-ACETAMINOPHEN 5-325 MG PO TABS
1.0000 | ORAL_TABLET | ORAL | Status: DC | PRN
  Administered 2013-11-24: 1 via ORAL
  Filled 2013-11-24: qty 1

## 2013-11-24 MED ORDER — OXYCODONE-ACETAMINOPHEN 5-325 MG PO TABS
1.0000 | ORAL_TABLET | ORAL | Status: DC | PRN
  Administered 2013-11-24 – 2013-11-26 (×5): 1 via ORAL
  Filled 2013-11-24 (×5): qty 1

## 2013-11-24 MED ORDER — LIDOCAINE HCL (PF) 1 % IJ SOLN: 30.0000 mL | INTRAMUSCULAR | Status: DC | PRN

## 2013-11-24 MED ORDER — OXYCODONE-ACETAMINOPHEN 5-325 MG PO TABS: 1.0000 | ORAL_TABLET | ORAL | Status: DC | PRN

## 2013-11-24 MED ORDER — ACETAMINOPHEN 325 MG PO TABS: 650.0000 mg | ORAL_TABLET | ORAL | Status: DC | PRN

## 2013-11-24 MED ORDER — DIBUCAINE 1 % RE OINT
1.0000 "application " | TOPICAL_OINTMENT | RECTAL | Status: DC | PRN
  Filled 2013-11-24 (×2): qty 28

## 2013-11-24 NOTE — H&P (Signed)
I examined pt and agree with documentation above and resident plan of care. Venia Carbon Michiel Cowboy, CNM

## 2013-11-24 NOTE — H&P (Signed)
Amy Cordova is a 28 y.o. female [phone] with IUP at [redacted]w[redacted]d presenting for Increasing frequency of contractions. Pt states she has been having regular, every 5 minutes contractions, associated with none vaginal bleeding.  Membranes are intact, with active fetal movement.   [facility] 10 wks  Prenatal History/Complications: Pt. Presents in active labor contracting q11min. No LOF, VB. She has +FM. She has an obstetrical hx significant for previous preterm delivery at [facility] 2/2 Preeclampsia. She has been on 17-P throughout this pregnancy. She has had no other complications. First gestation was delivered at [place]. Without complications. Heaviest previous baby 7lb. 22Wk U/S 44%. Only other significant past hx is cervical cancer s/p laser treatment. She has no other complaints.   Past Medical History: Past Medical History  Diagnosis Date  . Hypertension   . Cervical cancer   . Abnormal Pap smear 2000    cervical - laser treatment  . Pregnancy induced hypertension   . Headache(784.0)   . Urinary tract infection   . Trichomonas     Past Surgical History: Past Surgical History  Procedure Laterality Date  . Laser ablation condyloma cervical / vulvar      Obstetrical History: OB History   Grav Para Term Preterm Abortions TAB SAB Ect Mult Living   4 2 1 1 1  0 1 0 0 2      Gynecological History: OB History   Grav Para Term Preterm Abortions TAB SAB Ect Mult Living   4 2 1 1 1  0 1 0 0 2      Social History: History   Social History  . Marital Status: Single    Spouse Name: N/A    Number of Children: N/A  . Years of Education: N/A   Social History Main Topics  . Smoking status: Never Smoker   . Smokeless tobacco: Never Used  . Alcohol Use: No  . Drug Use: No  . Sexual Activity: Yes     Comment: last sex one week ago   Other Topics Concern  . None   Social History Narrative  . None    Family History: Family History  Problem Relation Age of Onset  .  Hypertension Mother   . Diabetes Mother   . Diabetes Father   . Hypertension Father   . Other Neg Hx     Allergies: Allergies  Allergen Reactions  . Other Other (See Comments)    Pt tends to get yeast infections from antibiotics. She can take antibiotics as long as she is given fluconazole.    Prescriptions prior to admission  Medication Sig Dispense Refill  . Prenatal Vit-Fe Fumarate-FA (PRENATAL MULTIVITAMIN) TABS tablet Take 1 tablet by mouth at bedtime.      . metroNIDAZOLE (FLAGYL) 500 MG tablet Take 1 tablet (500 mg total) by mouth 2 (two) times daily.  14 tablet  0     Review of Systems  Per HPI  Blood pressure 125/68, pulse 63, temperature 97.6 F (36.4 C), temperature source Oral, resp. rate 18, height 5\' 6"  (1.676 m), weight 115.214 kg (254 lb), last menstrual period 02/21/2013. General appearance: alert, cooperative and no distress Lungs: clear to auscultation bilaterally Heart: regular rate and rhythm Abdomen: soft, non-tender; bowel sounds normal Pelvic: 5cm, 90%, -2 Extremities: Homans sign is negative, no sign of DVT Grossly Neurologically Intact Presentation: cephalic Fetal monitoringBaseline: 125 bpm, Variability: Good {> 6 bpm) and Accelerations: Reactive Uterine activityFrequency: Every 6-8 minutes Dilation: 5 Effacement (%): 90 Station: -2  Exam by:: Dr. Jerilynn Mages   Prenatal labs: ABO, Rh: A/POS/-- (01/07 0943) Antibody: NEG (01/07 0943) Rubella:  Immune RPR: NON REAC (05/08 1017)  HBsAg: NEGATIVE (01/07 0943)  HIV: NONREACTIVE (05/08 1020)  GBS: Positive (07/02 0000)  1 hr Glucola 87 Genetic screening  Declined Anatomy US Normal   Prenatal Transfer Tool  Maternal Diabetes: No Genetic Screening: Declined Maternal Ultrasounds/Referrals: Normal Fetal Ultrasounds or other Referrals:  None Maternal Substance Abuse:  Yes:  Type: Marijuana Significant Maternal Medications:  None Significant Maternal Lab Results: Lab values include: Group B Strep  positive     Results for orders placed during the hospital encounter of 11/24/13 (from the past 24 hour(s))  CBC   Collection Time    11/24/13  5:09 AM      Result Value Ref Range   WBC 8.5  4.0 - 10.5 K/uL   RBC 3.66 (*) 3.87 - 5.11 MIL/uL   Hemoglobin 12.0  12.0 - 15.0 g/dL   HCT 34.7 (*) 36.0 - 46.0 %   MCV 94.8  78.0 - 100.0 fL   MCH 32.8  26.0 - 34.0 pg   MCHC 34.6  30.0 - 36.0 g/dL   RDW 13.1  11.5 - 15.5 %   Platelets 175  150 - 400 K/uL    Assessment: SHATIA SINDONI is a 28 y.o. [id number] at [redacted]w[redacted]d by L= 10W U/s here for Active Labor #Labor: Plan for expectant management at this time. Augmentation as necessary. Progressing well.   #Pain: Fentanyl #FWB: Cat I #ID:  PCN ppx #MOF: Breast #MOC: Will discuss #Circ:  Will discuss   Bernice Mullin Darnell Level 11/24/2013, 6:13 AM

## 2013-11-24 NOTE — MAU Note (Signed)
contractions 

## 2013-11-25 ENCOUNTER — Encounter (HOSPITAL_COMMUNITY): Payer: Self-pay | Admitting: Anesthesiology

## 2013-11-25 ENCOUNTER — Inpatient Hospital Stay (HOSPITAL_COMMUNITY): Payer: Medicaid Other | Admitting: Anesthesiology

## 2013-11-25 ENCOUNTER — Encounter (HOSPITAL_COMMUNITY): Payer: Medicaid Other | Admitting: Anesthesiology

## 2013-11-25 ENCOUNTER — Encounter (HOSPITAL_COMMUNITY)
Admission: AD | Disposition: A | Discharge status: Home / Self Care | Payer: Self-pay | Source: Ambulatory Visit | Attending: Obstetrics & Gynecology

## 2013-11-25 DIAGNOSIS — Z302 Encounter for sterilization: Secondary | ICD-10-CM

## 2013-11-25 HISTORY — PX: TUBAL LIGATION: SHX77

## 2013-11-25 SURGERY — LIGATION, FALLOPIAN TUBE, POSTPARTUM
Anesthesia: Spinal | Site: Abdomen | Laterality: Bilateral

## 2013-11-25 MED ORDER — DIPHENHYDRAMINE HCL 50 MG/ML IJ SOLN
INTRAMUSCULAR | Status: AC
  Filled 2013-11-25: qty 1

## 2013-11-25 MED ORDER — MIDAZOLAM HCL 2 MG/2ML IJ SOLN: 0.5000 mg | Freq: Once | INTRAMUSCULAR | Status: DC | PRN

## 2013-11-25 MED ORDER — FAMOTIDINE 20 MG PO TABS
40.0000 mg | ORAL_TABLET | Freq: Once | ORAL | Status: AC
  Administered 2013-11-25: 40 mg via ORAL
  Filled 2013-11-25: qty 2

## 2013-11-25 MED ORDER — PROMETHAZINE HCL 25 MG/ML IJ SOLN: 6.2500 mg | INTRAMUSCULAR | Status: DC | PRN

## 2013-11-25 MED ORDER — METOCLOPRAMIDE HCL 10 MG PO TABS
10.0000 mg | ORAL_TABLET | Freq: Once | ORAL | Status: AC
  Administered 2013-11-25: 10 mg via ORAL
  Filled 2013-11-25: qty 1

## 2013-11-25 MED ORDER — MEPERIDINE HCL 25 MG/ML IJ SOLN: 6.2500 mg | INTRAMUSCULAR | Status: DC | PRN

## 2013-11-25 MED ORDER — ONDANSETRON HCL 4 MG/2ML IJ SOLN
INTRAMUSCULAR | Status: AC
  Filled 2013-11-25: qty 2

## 2013-11-25 MED ORDER — PHENYLEPHRINE 40 MCG/ML (10ML) SYRINGE FOR IV PUSH (FOR BLOOD PRESSURE SUPPORT)
PREFILLED_SYRINGE | INTRAVENOUS | Status: AC
  Filled 2013-11-25: qty 10

## 2013-11-25 MED ORDER — KETOROLAC TROMETHAMINE 30 MG/ML IJ SOLN: 15.0000 mg | Freq: Once | INTRAMUSCULAR | Status: DC | PRN

## 2013-11-25 MED ORDER — BUPIVACAINE IN DEXTROSE 0.75-8.25 % IT SOLN
INTRATHECAL | Status: DC | PRN
  Administered 2013-11-25: 1.5 mL via INTRATHECAL

## 2013-11-25 MED ORDER — METOCLOPRAMIDE HCL 5 MG/ML IJ SOLN
INTRAMUSCULAR | Status: DC | PRN
  Administered 2013-11-25: 10 mg via INTRAVENOUS

## 2013-11-25 MED ORDER — DIPHENHYDRAMINE HCL 50 MG/ML IJ SOLN
INTRAMUSCULAR | Status: DC | PRN
  Administered 2013-11-25: 25 mg via INTRAVENOUS

## 2013-11-25 MED ORDER — ONDANSETRON HCL 4 MG/2ML IJ SOLN
INTRAMUSCULAR | Status: DC | PRN
  Administered 2013-11-25: 4 mg via INTRAVENOUS

## 2013-11-25 MED ORDER — PHENYLEPHRINE HCL 10 MG/ML IJ SOLN
INTRAMUSCULAR | Status: DC | PRN
  Administered 2013-11-25 (×3): 80 ug via INTRAVENOUS
  Administered 2013-11-25: 40 ug via INTRAVENOUS

## 2013-11-25 MED ORDER — METOCLOPRAMIDE HCL 5 MG/ML IJ SOLN
INTRAMUSCULAR | Status: AC
  Filled 2013-11-25: qty 2

## 2013-11-25 MED ORDER — MIDAZOLAM HCL 2 MG/2ML IJ SOLN
INTRAMUSCULAR | Status: DC | PRN
  Administered 2013-11-25: 2 mg via INTRAVENOUS

## 2013-11-25 MED ORDER — MIDAZOLAM HCL 2 MG/2ML IJ SOLN
INTRAMUSCULAR | Status: AC
Start: 2013-11-25 — End: 2013-11-25
  Filled 2013-11-25: qty 2

## 2013-11-25 MED ORDER — FENTANYL CITRATE 0.05 MG/ML IJ SOLN: 25.0000 ug | INTRAMUSCULAR | Status: DC | PRN

## 2013-11-25 MED ORDER — LACTATED RINGERS IV SOLN
INTRAVENOUS | Status: DC | PRN
  Administered 2013-11-25: 09:00:00 via INTRAVENOUS

## 2013-11-25 MED ORDER — LACTATED RINGERS IV SOLN: INTRAVENOUS | Status: DC

## 2013-11-25 MED ORDER — BUPIVACAINE HCL (PF) 0.25 % IJ SOLN
INTRAMUSCULAR | Status: AC
Start: 2013-11-25 — End: 2013-11-25
  Filled 2013-11-25: qty 30

## 2013-11-25 SURGICAL SUPPLY — 24 items
APPLICATOR COTTON TIP 6IN STRL (MISCELLANEOUS) ×14 IMPLANT
CLIP FILSHIE TUBAL LIGA STRL (Clip) ×3 IMPLANT
CLOTH BEACON ORANGE TIMEOUT ST (SAFETY) ×3 IMPLANT
DRSG OPSITE POSTOP 3X4 (GAUZE/BANDAGES/DRESSINGS) ×3 IMPLANT
DURAPREP 26ML APPLICATOR (WOUND CARE) ×3 IMPLANT
GLOVE BIO SURGEON STRL SZ7 (GLOVE) ×3 IMPLANT
GLOVE BIOGEL PI IND STRL 6 (GLOVE) IMPLANT
GLOVE BIOGEL PI IND STRL 7.0 (GLOVE) ×1 IMPLANT
GLOVE BIOGEL PI INDICATOR 6 (GLOVE) ×8
GLOVE BIOGEL PI INDICATOR 7.0 (GLOVE) ×2
GLOVE ECLIPSE 6.5 STRL STRAW (GLOVE) ×8 IMPLANT
GLOVE SURG SS PI 7.0 STRL IVOR (GLOVE) ×12 IMPLANT
GOWN STRL REUS W/TWL LRG LVL3 (GOWN DISPOSABLE) ×8 IMPLANT
NEEDLE HYPO 22GX1.5 SAFETY (NEEDLE) ×3 IMPLANT
NS IRRIG 1000ML POUR BTL (IV SOLUTION) ×3 IMPLANT
PACK ABDOMINAL MINOR (CUSTOM PROCEDURE TRAY) ×3 IMPLANT
SPONGE LAP 4X18 X RAY DECT (DISPOSABLE) ×2 IMPLANT
SUT VIC AB 0 CT1 27 (SUTURE) ×3
SUT VIC AB 0 CT1 27XBRD ANBCTR (SUTURE) ×1 IMPLANT
SUT VICRYL 4-0 PS2 18IN ABS (SUTURE) ×3 IMPLANT
SYR CONTROL 10ML LL (SYRINGE) ×3 IMPLANT
TOWEL OR 17X24 6PK STRL BLUE (TOWEL DISPOSABLE) ×8 IMPLANT
TRAY FOLEY CATH 14FR (SET/KITS/TRAYS/PACK) ×3 IMPLANT
WATER STERILE IRR 1000ML POUR (IV SOLUTION) ×3 IMPLANT

## 2013-11-25 NOTE — Progress Notes (Signed)
Post Partum Day 1 Subjective: no complaints, up ad lib, voiding, tolerating PO, + flatus and Doing well this am. No complaints BTL today.   Objective: Blood pressure 105/71, pulse 76, temperature 98.6 F (37 C), temperature source Oral, resp. rate 18, height 5\' 6"  (1.676 m), weight 115.214 kg (254 lb), last menstrual period 02/21/2013, SpO2 96.00%, unknown if currently breastfeeding.  Physical Exam:  General: alert, cooperative and no distress Lochia: appropriate Uterine Fundus: firm Incision: n/A DVT Evaluation: No evidence of DVT seen on physical exam. No cords or calf tenderness.   Recent Labs  11/24/13 0509  HGB 12.0  HCT 34.7*    Assessment/Plan: Plan for discharge tomorrow, Breastfeeding, Lactation consult and Contraception BTL prior to discharge.    LOS: 1 day   [place]ancon, Caleb G 11/25/2013, 7:51 AM   Pt seen and examined.  Patient desires permanent sterilization.  Other reversible forms of contraception were discussed with patient; she declines all other modalities. Risks of procedure discussed with patient including but not limited to: risk of regret, permanence of method, bleeding, infection, injury to surrounding organs and need for additional procedures.  Failure risk of 0.5-1% with increased risk of ectopic gestation if pregnancy occurs was also discussed with patient.    Patient verbalized understanding of these risks and wants to proceed with sterilization.  Written informed consent obtained.  To OR when ready.

## 2013-11-25 NOTE — Progress Notes (Signed)
Clinical Social Work Department PSYCHOSOCIAL ASSESSMENT - MATERNAL/CHILD 08-21-2013  Patient:  Amy Cordova  Account Number:  [id number]  Admit Date:  October 28, 2013  Ardine Eng Name:   [employer]    Clinical Social Worker:  Lucita Ferrara, CLINICAL SOCIAL WORKER   Date/Time:  2013/12/19 03:15 PM  Date Referred:  08-11-13   Referral source  [facility]     Referred reason  Substance Abuse   Other referral source:    I:  FAMILY / HOME ENVIRONMENT Child's legal guardian:  PARENT  Guardian - Name Guardian - Age Guardian - Address  [address], [place] [address]  Stormy Fabian  same as above   Other household support members/support persons Name Relationship DOB   DAUGHTER 74 years old   DAUGHTER 75 years old   Other support:   Per MOB, her mother and sister are actively involved in the family and are supportive.    II  PSYCHOSOCIAL DATA Information Source:  Patient Interview  Occupational hygienist Employment:   Per [employer], she is privately employed as a Quarry manager.  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  FOB is also employed full time in Field seismologist resources:  Medicaid If [place] / Grade:   Maternity Care Coordinator / Child Services Coordination / Early Interventions:  Cultural issues impacting care:   None reported    III  STRENGTHS Strengths  Adequate Resources  Compliance with medical plan  Home prepared for Child (including basic supplies)  Supportive family/friends   Strength comment:    IV  RISK FACTORS AND CURRENT PROBLEMS Current Problem:  YES   Risk Factor & Current Problem Patient Issue Family Issue Risk Factor / Current Problem Comment  Substance Abuse N N Positive for THC during prenatal visit on 1/7.  MOB stated that it was her first and only use, had not yet realized that when she was pregnant. Denied any other substance  abuse history.   N N     V  SOCIAL WORK ASSESSMENT CSW met with MOB in her first floor room, room 118 to complete the assessment.  Consult ordered due to positive drug screen on 1/7 for marijuana.  MOB receptive to completing the assessment, was open and receptive to answering questions.  MOB affect and mood appropriate to setting, no acute stressors or mental health symptoms noted.  MOB attentive to [facility], throughout.  MOB shared that she is excited to have a baby boy as her other two children are girls.  MOB denied any difficulties with coping with adjustment to having newborn.  She shared that she was surprised and shocked to learn that she was pregnant, but has adjusted appropriately and accordingly throughout her pregnancy.  She shared that all family members are excited for the arrival of their newest family member.  MOB showed CSW pictures of the nursery at home and smiled as she showed CSW indicating pride and excitement, reported that she has secured all necessary items for the baby.  MOB denied mental health history and denied history of PPD and anxiety following previous pregnancies.  MOB attentive as CSW reviewed signs and symptoms of PPD and anxiety, and willing to notify her MD if she notices symptoms.  CSW shared reason for consult and meconium screen policy.  MOB willingly discussed substance history with CSW.  She shared that she was not yet aware  that she was pregnant, and had tried [facility] for first time around New Years and her birthday.  She shared that it was the first and only time, and admittedly denies previous experimentation with substances and substance abuse treatment. MOB denied concerns related to meconium screen since she does not believe that it will come back positive.  MOB aware that CPS will become involved result is positive, denied any concerns related to this possibility, and denied previous CPS involvement.    MOB aware of ongoing support available from CSW  throughout admission.  MOB reported intention to follow-up with CSW if needed.    No barriers to discharge.    VI SOCIAL WORK PLAN Social Work Therapist, art  No Further Intervention Required / No Barriers to Discharge   Type of pt/family education:   PPD and anxiety   If child protective services report - county:   If child protective services report - date:   Information/referral to community resources comment:   Other social work plan:

## 2013-11-25 NOTE — Transfer of Care (Signed)
Immediate Anesthesia Transfer of Care Note  Patient: Amy Cordova  Procedure(s) Performed: Procedure(s): POST PARTUM TUBAL LIGATION (Bilateral)  Patient Location: PACU  Anesthesia Type:Spinal  Level of Consciousness: awake, alert  and oriented  Airway & Oxygen Therapy: Patient Spontanous Breathing  Post-op Assessment: Report given to PACU RN and Post -op Vital signs reviewed and stable  Post vital signs: Reviewed and stable  Complications: No apparent anesthesia complications

## 2013-11-25 NOTE — Op Note (Signed)
Amy Cordova 11/24/2013 - 11/25/2013  PREOPERATIVE DIAGNOSIS:  Multiparity, undesired fertility  POSTOPERATIVE DIAGNOSIS:  Multiparity, undesired fertility  PROCEDURE:  Postpartum Bilateral Tubal Sterilization using Filshie Clips   ANESTHESIA:  Spinal and local 0.25% Bupivicaine  COMPLICATIONS:  None immediate.  ESTIMATED BLOOD LOSS:  10 ml.  INDICATIONS: 28 y.o. [id number]  with undesired fertility,status post vaginal delivery, desires permanent sterilization.  Other reversible forms of contraception were discussed with patient; she declines all other modalities. Risks of procedure discussed with patient including but not limited to: risk of regret, permanence of method, bleeding, infection, injury to surrounding organs and need for additional procedures.  Failure risk of 0.5-1% with increased risk of ectopic gestation if pregnancy occurs was also discussed with patient.     FINDINGS:  Normal uterus, tubes, and ovaries.  PROCEDURE DETAILS: The patient was taken to the operating room where her spinal anesthesia was found to be adequate.  She was then placed in the dorsal supine position and prepped and draped in sterile fashion.  After an adequate timeout was performed, attention was turned to the patient's abdomen where a small umbilical skin incision was made. The incision was taken down to the layer of fascia using the scalpel, and fascia was incised, and extended bilaterally using Mayo scissors. The peritoneum was entered in a sharp fashion. It is noted that the patient has a deep umbilicus and peritoneal entry was challenging.  Attention was then turned to the patient's uterus, and left fallopian tube was identified and followed out to the fimbriated end.  A Filshie clip was placed on the left fallopian tube about 3 cm from the cornual attachment, with care given to incorporate the underlying mesosalpinx.  A similar process was carried out on the right side allowing for bilateral tubal  sterilization.  Good hemostasis was noted overall.The instruments were then removed from the patient's abdomen and the fascial incision was repaired with 0 Vicryl, and the skin was closed with a 4-0 Vicryl subcuticular stitch. Ten cc of .25% bupivacaine was injected into the subcutaneous tissue.  The patient tolerated the procedure well.  Instrument, sponge, and needle counts were correct times two.  The patient was then taken to the recovery room awake and in stable condition.  [name] H. 11/25/2013 10:56 AM

## 2013-11-25 NOTE — Anesthesia Procedure Notes (Signed)
Spinal  Patient location during procedure: OR Start time: 11/25/2013 9:39 AM Staffing Anesthesiologist: Rudean Curt Performed by: anesthesiologist  Preanesthetic Checklist Completed: patient identified, site marked, surgical consent, pre-op evaluation, timeout performed, IV checked, risks and benefits discussed and monitors and equipment checked Spinal Block Patient position: sitting Prep: DuraPrep Patient monitoring: heart rate, cardiac monitor, continuous pulse ox and blood pressure Approach: midline Location: L3-4 Injection technique: single-shot Needle Needle type: Sprotte  Needle gauge: 24 G Needle length: 9 cm Assessment Sensory level: T4 Additional Notes Patient identified.  Risk benefits discussed including failed block, incomplete pain control, headache, nerve damage, paralysis, blood pressure changes, nausea, vomiting, reactions to medication both toxic or allergic, and postpartum back pain.  Patient expressed understanding and wished to proceed.  All questions were answered.  Sterile technique used throughout procedure.  CSF was clear.  No parasthesia or other complications.  Please see nursing notes for vital signs.

## 2013-11-25 NOTE — Progress Notes (Signed)
UR chart review completed.  

## 2013-11-25 NOTE — Addendum Note (Signed)
Addendum created 11/25/13 2246 by Alvy Bimler, CRNA   Modules edited: Notes Section   Notes Section:  File: [id number]

## 2013-11-25 NOTE — Anesthesia Postprocedure Evaluation (Signed)
  Anesthesia Post-op Note  Patient: Amy Cordova  Procedure(s) Performed: Procedure(s): POST PARTUM TUBAL LIGATION (Bilateral)  Patient Location: PACU and Mother/Baby  Anesthesia Type:Epidural  Level of Consciousness: awake, alert , oriented and patient cooperative  Airway and Oxygen Therapy: Patient Spontanous Breathing  Post-op Pain: none  Post-op Assessment: Post-op Vital signs reviewed, Patient's Cardiovascular Status Stable, Respiratory Function Stable, Patent Airway, No signs of Nausea or vomiting, Adequate PO intake, Pain level controlled, No headache, No backache, No residual numbness and No residual motor weakness  Post-op Vital Signs: Reviewed and stable  Last Vitals:  Filed Vitals:   11/25/13 2208  BP: 128/67  Pulse: 73  Temp: 36.8 C  Resp: 18    Complications: No apparent anesthesia complications

## 2013-11-25 NOTE — Anesthesia Preprocedure Evaluation (Addendum)
Anesthesia Evaluation  Patient identified by MRN, date of birth, ID band Patient awake    Reviewed: Allergy & Precautions, H&P , Patient's Chart, lab work & pertinent test results  Airway Mallampati: III      Dental   Pulmonary  breath sounds clear to auscultation        Cardiovascular Exercise Tolerance: Good Rhythm:regular Rate:Normal     Neuro/Psych negative psych ROS   GI/Hepatic   Endo/Other  Morbid obesity  Renal/GU      Musculoskeletal   Abdominal   Peds  Hematology   Anesthesia Other Findings   Reproductive/Obstetrics                          Anesthesia Physical Anesthesia Plan  ASA: III  Anesthesia Plan: Spinal   Post-op Pain Management:    Induction:   Airway Management Planned:   Additional Equipment:   Intra-op Plan:   Post-operative Plan:   Informed Consent: I have reviewed the patients History and Physical, chart, labs and discussed the procedure including the risks, benefits and alternatives for the proposed anesthesia with the patient or authorized representative who has indicated his/her understanding and acceptance.     Plan Discussed with: Anesthesiologist, CRNA and Surgeon  Anesthesia Plan Comments:         Anesthesia Quick Evaluation

## 2013-11-25 NOTE — Lactation Note (Signed)
This note was copied from the chart of Amy Cordova. Lactation Consultation Note Report from [place], Dianna, mom is not pumping and has been bottle feeding only.  Mom to call for [facility] assist as needed.  Follow up PRN.  Patient Name: Amy Cordova AXENM'M Date: 11/25/2013     Maternal Data    Feeding Feeding Type: Bottle Fed - Formula  LATCH Score/Interventions                      Lactation Tools Discussed/Used     Consult Status      Shoptaw, Justine Null 11/25/2013, 9:12 PM

## 2013-11-25 NOTE — Anesthesia Postprocedure Evaluation (Signed)
  Anesthesia Post Note  Patient: Amy Cordova  Procedure(s) Performed: Procedure(s) (LRB): POST PARTUM TUBAL LIGATION (Bilateral)  Anesthesia type: Spinal  Patient location: PACU  Post pain: Pain level controlled  Post assessment: Post-op Vital signs reviewed  Last Vitals:  Filed Vitals:   11/25/13 1031  BP:   Pulse:   Temp: 36.7 C  Resp:     Post vital signs: Reviewed  Level of consciousness: awake  Complications: No apparent anesthesia complications

## 2013-11-26 ENCOUNTER — Encounter (HOSPITAL_COMMUNITY): Payer: Self-pay | Admitting: Obstetrics & Gynecology

## 2013-11-26 MED ORDER — FLUCONAZOLE 150 MG PO TABS: 150.0000 mg | ORAL_TABLET | ORAL | Status: DC | PRN

## 2013-11-26 MED ORDER — OXYCODONE-ACETAMINOPHEN 2.5-325 MG PO TABS: 2.0000 | ORAL_TABLET | ORAL | Status: DC | PRN

## 2013-11-26 MED ORDER — OXYCODONE-ACETAMINOPHEN 5-325 MG PO TABS: 1.0000 | ORAL_TABLET | ORAL | Status: DC | PRN

## 2013-11-26 MED ORDER — BUPIVACAINE HCL (PF) 0.25 % IJ SOLN
INTRAMUSCULAR | Status: DC | PRN
  Administered 2013-11-26: 10 mL

## 2013-11-26 MED ORDER — IBUPROFEN 600 MG PO TABS: 600.0000 mg | ORAL_TABLET | Freq: Four times a day (QID) | ORAL | Status: DC

## 2013-11-26 NOTE — Discharge Summary (Signed)
Attestation of Attending Supervision of Advanced Practitioner (PA/CNM/NP): Evaluation and management procedures were performed by the Advanced Practitioner under my supervision and collaboration.  I have reviewed the Advanced Practitioner's note and chart, and I agree with the management and plan.  Keasha Malkiewicz, MD, FACOG Attending Obstetrician & Gynecologist Faculty Practice, [facility]   

## 2013-11-26 NOTE — Lactation Note (Signed)
This note was copied from the chart of Amy Cordova. Lactation Consultation Note  Patient Name: Amy Cordova Date: 11/26/2013 Reason for consult: Follow-up assessment Mom reports she is bottle feeding.   Maternal Data    Feeding    LATCH Score/Interventions                      Lactation Tools Discussed/Used     Consult Status Consult Status: Complete Date: 11/26/13 Follow-up type: In-patient    [name] 11/26/2013, 11:20 AM

## 2013-11-26 NOTE — Discharge Instructions (Signed)
Postpartum Care After Vaginal Delivery  After you deliver your newborn (postpartum period), the usual stay in the hospital is 24-72 hours. If there were problems with your labor or delivery, or if you have other medical problems, you might be in the hospital longer.  While you are in the hospital, you will receive help and instructions on how to care for yourself and your newborn during the postpartum period.  While you are in the hospital:  Be sure to tell your nurses if you have pain or discomfort, as well as where you feel the pain and what makes the pain worse.  If you had an incision made near your vagina (episiotomy) or if you had some tearing during delivery, the nurses may put ice packs on your episiotomy or tear. The ice packs may help to reduce the pain and swelling.  If you are breastfeeding, you may feel uncomfortable contractions of your uterus for a couple of weeks. This is normal. The contractions help your uterus get back to normal size.  It is normal to have some bleeding after delivery.  For the first 1-3 days after delivery, the flow is red and the amount may be similar to a period.  It is common for the flow to start and stop.  In the first few days, you may pass some small clots. Let your nurses know if you begin to pass large clots or your flow increases.  Do not flush blood clots down the toilet before having the nurse look at them.  During the next 3-10 days after delivery, your flow should become more watery and pink or brown-tinged in color.  Ten to fourteen days after delivery, your flow should be a small amount of yellowish-white discharge.  The amount of your flow will decrease over the first few weeks after delivery. Your flow may stop in 6-8 weeks. Most women have had their flow stop by 12 weeks after delivery. You should change your sanitary pads frequently.  Wash your hands thoroughly with soap and water for at least 20 seconds after changing pads, using the toilet,  or before holding or feeding your newborn.  You should feel like you need to empty your bladder within the first 6-8 hours after delivery.  In case you become weak, lightheaded, or faint, call your nurse before you get out of bed for the first time and before you take a shower for the first time.  Within the first few days after delivery, your breasts may begin to feel tender and full. This is called engorgement. Breast tenderness usually goes away within 48-72 hours after engorgement occurs. You may also notice milk leaking from your breasts. If you are not breastfeeding, do not stimulate your breasts. Breast stimulation can make your breasts produce more milk.  Spending as much time as possible with your newborn is very important. During this time, you and your newborn can feel close and get to know each other. Having your newborn stay in your room (rooming in) will help to strengthen the bond with your newborn. It will give you time to get to know your newborn and become comfortable caring for your newborn.  Your hormones change after delivery. Sometimes the hormone changes can temporarily cause you to feel sad or tearful. These feelings should not last more than a few days. If these feelings last longer than that, you should talk to your caregiver.  If desired, talk to your caregiver about methods of family planning or contraception.  Talk to your caregiver about immunizations. Your caregiver may want you to have the following immunizations before leaving the hospital:  Tetanus, diphtheria, and pertussis (Tdap) or tetanus and diphtheria (Td) immunization. It is very important that you and your family (including grandparents) or others caring for your newborn are up-to-date with the Tdap or Td immunizations. The Tdap or Td immunization can help protect your newborn from getting ill.  Rubella immunization.  Varicella (chickenpox) immunization.  Influenza immunization. You should receive this annual  immunization if you did not receive the immunization during your pregnancy. Document Released: 02/12/2007 Document Revised: 01/10/2012 Document Reviewed: 12/13/2011  [facility] Patient Information 2015 [place], [place]. This information is not intended to replace advice given to you by your health care provider. Make sure you discuss any questions you have with your health care provider.   Breastfeeding Deciding to breastfeed is one of the best choices you can make for you and your baby. A change in hormones during pregnancy causes your breast tissue to grow and increases the number and size of your milk ducts. These hormones also allow proteins, sugars, and fats from your blood supply to make breast milk in your milk-producing glands. Hormones prevent breast milk from being released before your baby is born as well as prompt milk flow after birth. Once breastfeeding has begun, thoughts of your baby, as well as his or her sucking or crying, can stimulate the release of milk from your milk-producing glands.  BENEFITS OF BREASTFEEDING For Your Baby  Your first milk (colostrum) helps your baby's digestive system function better.   There are antibodies in your milk that help your baby fight off infections.   Your baby has a lower incidence of asthma, allergies, and sudden infant death syndrome.   The nutrients in breast milk are better for your baby than infant formulas and are designed uniquely for your baby's needs.   Breast milk improves your baby's brain development.   Your baby is less likely to develop other conditions, such as childhood obesity, asthma, or type 2 diabetes mellitus.  For You   Breastfeeding helps to create a very special bond between you and your baby.   Breastfeeding is convenient. Breast milk is always available at the correct temperature and costs nothing.   Breastfeeding helps to burn calories and helps you lose the weight gained during pregnancy.    Breastfeeding makes your uterus contract to its prepregnancy size faster and slows bleeding (lochia) after you give birth.   Breastfeeding helps to lower your risk of developing type 2 diabetes mellitus, osteoporosis, and breast or ovarian cancer later in life. SIGNS THAT YOUR BABY IS HUNGRY Early Signs of Hunger  Increased alertness or activity.  Stretching.  Movement of the head from side to side.  Movement of the head and opening of the mouth when the corner of the mouth or cheek is stroked (rooting).  Increased sucking sounds, smacking lips, cooing, sighing, or squeaking.  Hand-to-mouth movements.  Increased sucking of fingers or hands. Late Signs of Hunger  Fussing.  Intermittent crying. Extreme Signs of Hunger Signs of extreme hunger will require calming and consoling before your baby will be able to breastfeed successfully. Do not wait for the following signs of extreme hunger to occur before you initiate breastfeeding:   Restlessness.  A loud, strong cry.   Screaming. BREASTFEEDING BASICS Breastfeeding Initiation  Find a comfortable place to sit or lie down, with your neck and back well supported.  Place a pillow or  rolled up blanket under your baby to bring him or her to the level of your breast (if you are seated). Nursing pillows are specially designed to help support your arms and your baby while you breastfeed.  Make sure that your baby's abdomen is facing your abdomen.   Gently massage your breast. With your fingertips, massage from your chest wall toward your nipple in a circular motion. This encourages milk flow. You may need to continue this action during the feeding if your milk flows slowly.  Support your breast with 4 fingers underneath and your thumb above your nipple. Make sure your fingers are well away from your nipple and your baby's mouth.   Stroke your baby's lips gently with your finger or nipple.   When your baby's mouth is open  wide enough, quickly bring your baby to your breast, placing your entire nipple and as much of the colored area around your nipple (areola) as possible into your baby's mouth.   More areola should be visible above your baby's upper lip than below the lower lip.   Your baby's tongue should be between his or her lower gum and your breast.   Ensure that your baby's mouth is correctly positioned around your nipple (latched). Your baby's lips should create a seal on your breast and be turned out (everted).  It is common for your baby to suck about 2-3 minutes in order to start the flow of breast milk. Latching Teaching your baby how to latch on to your breast properly is very important. An improper latch can cause nipple pain and decreased milk supply for you and poor weight gain in your baby. Also, if your baby is not latched onto your nipple properly, he or she may swallow some air during feeding. This can make your baby fussy. Burping your baby when you switch breasts during the feeding can help to get rid of the air. However, teaching your baby to latch on properly is still the best way to prevent fussiness from swallowing air while breastfeeding. Signs that your baby has successfully latched on to your nipple:    Silent tugging or silent sucking, without causing you pain.   Swallowing heard between every 3-4 sucks.    Muscle movement above and in front of his or her ears while sucking.  Signs that your baby has not successfully latched on to nipple:   Sucking sounds or smacking sounds from your baby while breastfeeding.  Nipple pain. If you think your baby has not latched on correctly, slip your finger into the corner of your baby's mouth to break the suction and place it between your baby's gums. Attempt breastfeeding initiation again. Signs of Successful Breastfeeding Signs from your baby:   A gradual decrease in the number of sucks or complete cessation of sucking.   Falling  asleep.   Relaxation of his or her body.   Retention of a small amount of milk in his or her mouth.   Letting go of your breast by himself or herself. Signs from you:  Breasts that have increased in firmness, weight, and size 1-3 hours after feeding.   Breasts that are softer immediately after breastfeeding.  Increased milk volume, as well as a change in milk consistency and color by the fifth day of breastfeeding.   Nipples that are not sore, cracked, or bleeding. Signs That Your [employer] is Getting Enough Milk  Wetting at least 3 diapers in a 24-hour period. The urine should be clear and  pale yellow by age 29 days.  At least 3 stools in a 24-hour period by age 16 days. The stool should be soft and yellow.  At least 3 stools in a 24-hour period by age 298 days. The stool should be seedy and yellow.  No loss of weight greater than 10% of birth weight during the first 50 days of age.  Average weight gain of 4-7 ounces (113-198 g) per week after age 29 days.  Consistent daily weight gain by age 296 days, without weight loss after the age of 2 weeks. After a feeding, your baby may spit up a small amount. This is common. BREASTFEEDING FREQUENCY AND DURATION Frequent feeding will help you make more milk and can prevent sore nipples and breast engorgement. Breastfeed when you feel the need to reduce the fullness of your breasts or when your baby shows signs of hunger. This is called "breastfeeding on demand." Avoid introducing a pacifier to your baby while you are working to establish breastfeeding (the first 4-6 weeks after your baby is born). After this time you may choose to use a pacifier. Research has shown that pacifier use during the first year of a baby's life decreases the risk of sudden infant death syndrome (SIDS). Allow your baby to feed on each breast as long as he or she wants. Breastfeed until your baby is finished feeding. When your baby unlatches or falls asleep while feeding from  the first breast, offer the second breast. Because newborns are often sleepy in the first few weeks of life, you may need to awaken your baby to get him or her to feed. Breastfeeding times will vary from baby to baby. However, the following rules can serve as a guide to help you ensure that your baby is properly fed:  Newborns (babies 97 weeks of age or younger) may breastfeed every 1-3 hours.  Newborns should not go longer than 3 hours during the day or 5 hours during the night without breastfeeding.  You should breastfeed your baby a minimum of 8 times in a 24-hour period until you begin to introduce solid foods to your baby at around 40 months of age. BREAST MILK PUMPING Pumping and storing breast milk allows you to ensure that your baby is exclusively fed your breast milk, even at times when you are unable to breastfeed. This is especially important if you are going back to work while you are still breastfeeding or when you are not able to be present during feedings. Your lactation consultant can give you guidelines on how long it is safe to store breast milk.  A breast pump is a machine that allows you to pump milk from your breast into a sterile bottle. The pumped breast milk can then be stored in a refrigerator or freezer. Some breast pumps are operated by hand, while others use electricity. Ask your lactation consultant which type will work best for you. Breast pumps can be purchased, but some hospitals and breastfeeding support groups lease breast pumps on a monthly basis. A lactation consultant can teach you how to hand express breast milk, if you prefer not to use a pump.  CARING FOR YOUR BREASTS WHILE YOU BREASTFEED Nipples can become dry, cracked, and sore while breastfeeding. The following recommendations can help keep your breasts moisturized and healthy:  Avoid using soap on your nipples.   Wear a supportive bra. Although not required, special nursing bras and tank tops are designed to  allow access to your breasts for  breastfeeding without taking off your entire bra or top. Avoid wearing underwire-style bras or extremely tight bras.  Air dry your nipples for 3-69minutes after each feeding.   Use only cotton bra pads to absorb leaked breast milk. Leaking of breast milk between feedings is normal.   Use lanolin on your nipples after breastfeeding. Lanolin helps to maintain your skin's normal moisture barrier. If you use pure lanolin, you do not need to wash it off before feeding your baby again. Pure lanolin is not toxic to your baby. You may also hand express a few drops of breast milk and gently massage that milk into your nipples and allow the milk to air dry. In the first few weeks after giving birth, some women experience extremely full breasts (engorgement). Engorgement can make your breasts feel heavy, warm, and tender to the touch. Engorgement peaks within 3-5 days after you give birth. The following recommendations can help ease engorgement:  Completely empty your breasts while breastfeeding or pumping. You may want to start by applying warm, moist heat (in the shower or with warm water-soaked hand towels) just before feeding or pumping. This increases circulation and helps the milk flow. If your baby does not completely empty your breasts while breastfeeding, pump any extra milk after he or she is finished.  Wear a snug bra (nursing or regular) or tank top for 1-2 days to signal your body to slightly decrease milk production.  Apply ice packs to your breasts, unless this is too uncomfortable for you.  Make sure that your baby is latched on and positioned properly while breastfeeding. If engorgement persists after 48 hours of following these recommendations, contact your health care provider or a Science writer. OVERALL HEALTH CARE RECOMMENDATIONS WHILE BREASTFEEDING  Eat healthy foods. Alternate between meals and snacks, eating 3 of each per day. Because what you  eat affects your breast milk, some of the foods may make your baby more irritable than usual. Avoid eating these foods if you are sure that they are negatively affecting your baby.  Drink milk, fruit juice, and water to satisfy your thirst (about 10 glasses a day).   Rest often, relax, and continue to take your prenatal vitamins to prevent fatigue, stress, and anemia.  Continue breast self-awareness checks.  Avoid chewing and smoking tobacco.  Avoid alcohol and drug use. Some medicines that may be harmful to your baby can pass through breast milk. It is important to ask your health care provider before taking any medicine, including all over-the-counter and prescription medicine as well as vitamin and herbal supplements. It is possible to become pregnant while breastfeeding. If birth control is desired, ask your health care provider about options that will be safe for your baby. SEEK MEDICAL CARE IF:   You feel like you want to stop breastfeeding or have become frustrated with breastfeeding.  You have painful breasts or nipples.  Your nipples are cracked or bleeding.  Your breasts are red, tender, or warm.  You have a swollen area on either breast.  You have a fever or chills.  You have nausea or vomiting.  You have drainage other than breast milk from your nipples.  Your breasts do not become full before feedings by the fifth day after you give birth.  You feel sad and depressed.  Your baby is too sleepy to eat well.  Your baby is having trouble sleeping.   Your baby is wetting less than 3 diapers in a 24-hour period.  Your baby has  less than 3 stools in a 24-hour period.  Your baby's skin or the white part of his or her eyes becomes yellow.   Your baby is not gaining weight by 62 days of age. SEEK IMMEDIATE MEDICAL CARE IF:   Your baby is overly tired (lethargic) and does not want to wake up and feed.  Your baby develops an unexplained fever. Document Released:  04/17/2005 Document Revised: 04/22/2013 Document Reviewed: 10/09/2012 [facility] Patient Information 2015 [place], [place]. This information is not intended to replace advice given to you by your health care provider. Make sure you discuss any questions you have with your health care provider.

## 2013-11-26 NOTE — Discharge Summary (Signed)
Obstetric Discharge Summary Reason for Admission: onset of labor Prenatal Procedures: none Intrapartum Procedures: spontaneous vaginal delivery Postpartum Procedures: P.P. tubal ligation Complications-Operative and Postpartum: none Hemoglobin  Date Value Ref Range Status  11/24/2013 12.0  12.0 - 15.0 g/dL Final     HCT  Date Value Ref Range Status  11/24/2013 34.7* 36.0 - 46.0 % Final    28 year old [id number] who presented in active labor at [redacted]w[redacted]d gestation now s/p vaginal delivery. Patient had routine progression of labor and delivered 6 hours after arrival to L&D. She was given abx prophylaxis for GBS+ status. Vaginal delivery was uncomplicated and blood loss was minimal. One first day postpartum, patient had a bilateral tubal ligation without complication. Recovery since then has been good and she has no signs of wound infection. Vitals have been stable and patient is improving. She was discharged home with diflucan for treatment of suspected vaginal candidiasis. Followup with prenatal provider in 6 weeks.  Physical Exam:  General: alert, cooperative and no distress Lochia: appropriate Uterine Fundus: firm Incision: healing well DVT Evaluation: No evidence of DVT seen on physical exam. Negative Homan's sign. No cords or calf tenderness.  Discharge Diagnoses: Term Pregnancy-delivered S/P tubal ligation  Discharge Information: Date: 11/26/2013 Activity: pelvic rest Diet: routine Medications: Ibuprofen Condition: stable Instructions: Contact physician for worsening pain, bleeding, nausea, fever Discharge to: home Follow-up Information   Follow up with DOVE,MYRA C., MD In 6 weeks.   Specialty:  Obstetrics and Gynecology   Contact information:   [address] [place] [address] [phone]       Newborn Data: Live born female  Birth Weight: 5 lb 14.9 oz (2690 g) APGAR: 9, 9  Home with mother.  Toney Reil A 11/26/2013, 6:24 AM  I was present for the exam  and agree with above.  [place], CNM 11/26/2013 9:16 AM

## 2013-11-27 ENCOUNTER — Telehealth: Payer: Self-pay

## 2013-11-27 ENCOUNTER — Encounter: Payer: Medicaid Other | Admitting: Family

## 2013-11-27 NOTE — Telephone Encounter (Signed)
Pt called and stated that she was discharge today, Wednesday, and was given an Rx for percocet and she told by two pharmacies that the dosage is too low they did not have medication.  Attempted to contact pt at all three contact #'s telephone # not working numbers. Re:  Looks like [place] reordered the correct Rx so need to ask pt if she picked up correct medication Rx. If pt has not picked up medication per Dr. Harolyn Rutherford we can reorder Rx.

## 2013-11-27 NOTE — Addendum Note (Signed)
Addendum created 11/27/13 1200 by Rudean Curt, MD   Modules edited: Anesthesia Responsible Staff

## 2013-12-01 ENCOUNTER — Other Ambulatory Visit: Payer: Self-pay | Admitting: Internal Medicine

## 2013-12-01 ENCOUNTER — Encounter: Payer: Self-pay | Admitting: Obstetrics & Gynecology

## 2013-12-01 ENCOUNTER — Ambulatory Visit (INDEPENDENT_AMBULATORY_CARE_PROVIDER_SITE_OTHER): Payer: Medicaid Other | Admitting: Obstetrics & Gynecology

## 2013-12-01 VITALS — BP 134/84 | HR 75 | Temp 98.2°F | Wt 233.7 lb

## 2013-12-01 DIAGNOSIS — N61 Mastitis without abscess: Secondary | ICD-10-CM

## 2013-12-01 MED ORDER — DICLOXACILLIN SODIUM 250 MG PO CAPS: 250.0000 mg | ORAL_CAPSULE | Freq: Four times a day (QID) | ORAL | Status: DC

## 2013-12-01 MED ORDER — DICLOXACILLIN SODIUM 500 MG PO CAPS: 500.0000 mg | ORAL_CAPSULE | Freq: Four times a day (QID) | ORAL | Status: DC

## 2013-12-01 MED ORDER — FLUCONAZOLE 150 MG PO TABS: 150.0000 mg | ORAL_TABLET | Freq: Once | ORAL | Status: AC

## 2013-12-01 NOTE — Progress Notes (Signed)
Subjective:     Patient ID: Amy Cordova, female   DOB: 1985-09-18, 28 y.o.   MRN: [id number]  HPI Comments: 28 y.o PMH cervical cancer, HTN, trichomonas, G4P3  She presents for  1. Compliant with breast pain (pain is 7/10 at most at night) and lumps b/l R>L noted since 11/24/13 after delivering her child.  Since initially noticing lumps she also has redness to skin of b/l breast and size of lumps is getting bigger and she is getting more lumps.  She also has discharge clear to b/l nipples esp. Right nipple but denies bloody discharge.  Her breasts feel heavy.  She states wearing a bra helps support and the heaviness and cool water helps.  She states warm water in the shower does not help.  She is not currently breastfeeding    Review of Systems  Constitutional: Positive for fever and chills. Negative for diaphoresis, appetite change and unexpected weight change.       Objective:   Physical Exam  Nursing note and vitals reviewed. Constitutional: She is oriented to person, place, and time. She appears well-developed and well-nourished. No distress.  HENT:  Head: Normocephalic and atraumatic.  Eyes: Conjunctivae are normal.  Cardiovascular: Normal rate and regular rhythm.   Pulmonary/Chest: Effort normal. Right breast exhibits mass, nipple discharge, skin change and tenderness. Left breast exhibits mass, skin change and tenderness.  Erythema to skin b/l breasts   Diffuse lumps to b/l breasts with mod ttp and mod ttp left axilla   Right nipple with clear discharge likely milk  Abdominal:    Neurological: She is alert and oriented to person, place, and time.  Skin:  See breasts documentation  Psychiatric: She has a normal mood and affect. Her speech is normal and behavior is normal. Judgment and thought content normal. Cognition and memory are normal.     Assessment:     Plan:

## 2013-12-01 NOTE — Patient Instructions (Signed)
Mastitis Mastitis is inflammation of the breast tissue. It occurs most often in women who are breastfeeding, but it can also affect other women, and even sometimes men. CAUSES  Mastitis is usually caused by a bacterial infection. Bacteria enter the breast tissue through cuts or openings in the skin. Typically, this occurs with breastfeeding because of cracked or irritated skin. Sometimes, it can occur even when there is no opening in the skin. It can be associated with plugged milk (lactiferous) ducts. Nipple piercing can also lead to mastitis. Also, some forms of breast cancer can cause mastitis. SIGNS AND SYMPTOMS   Swelling, redness, tenderness, and pain in an area of the breast.  Swelling of the glands under the arm on the same side.  Fever. If an infection is allowed to progress, a collection of pus (abscess) may develop. DIAGNOSIS  Your health care provider can usually diagnose mastitis based on your symptoms and a physical exam. Tests may be done to help confirm the diagnosis. These may include:   Removal of pus from the breast by applying pressure to the area. This pus can be examined in the lab to determine which bacteria are present. If an abscess has developed, the fluid in the abscess can be removed with a needle. This can also be used to confirm the diagnosis and determine the bacteria present. In most cases, pus will not be present.  Blood tests to determine if your body is fighting a bacterial infection.  Mammogram or ultrasound tests to rule out other problems or diseases. TREATMENT  Antibiotic medicine is used to treat a bacterial infection. Your health care provider will determine which bacteria are most likely causing the infection and will select an appropriate antibiotic. This is sometimes changed based on the results of tests performed to identify the bacteria, or if there is no response to the antibiotic selected. Antibiotics are usually given by mouth. You may also be  given medicine for pain. Mastitis that occurs with breastfeeding will sometimes go away on its own, so your health care provider may choose to wait 24 hours after first seeing you to decide whether a prescription medicine is needed. HOME CARE INSTRUCTIONS   Only take over-the-counter or prescription medicines for pain, fever, or discomfort as directed by your health care provider.  If your health care provider prescribed an antibiotic, take the medicine as directed. Make sure you finish it even if you start to feel better.  Do not wear a tight or underwire bra. Wear a soft, supportive bra.  Increase your fluid intake, especially if you have a fever.  Women who are breastfeeding should follow these instructions:  Continue to empty the breast. Your health care provider can tell you whether this milk is safe for your infant or needs to be thrown out. You may be told to stop nursing until your health care provider thinks it is safe for your baby. Use a breast pump if you are advised to stop nursing.  Keep your nipples clean and dry.  Empty the first breast completely before going to the other breast. If your baby is not emptying your breasts completely for some reason, use a breast pump to empty your breasts.  If you go back to work, pump your breasts while at work to stay in time with your nursing schedule.  Avoid allowing your breasts to become overly filled with milk (engorged). SEEK MEDICAL CARE IF:   You have pus-like discharge from the breast.  Your symptoms do not   improve with the treatment prescribed by your health care provider within 2 days. SEEK IMMEDIATE MEDICAL CARE IF:   Your pain and swelling are getting worse.  You have pain that is not controlled with medicine.  You have a red line extending from the breast toward your armpit.  You have a fever or persistent symptoms for more than 2-3 days.  You have a fever and your symptoms suddenly get worse. Document Released:  04/17/2005 Document Revised: 04/22/2013 Document Reviewed: 11/15/2012 [employer] Patient Information 2015 [employer]. This information is not intended to replace advice given to you by your health care provider. Make sure you discuss any questions you have with your health care provider.  

## 2013-12-01 NOTE — Assessment & Plan Note (Signed)
Incision site to umbilicus well healed

## 2013-12-01 NOTE — Assessment & Plan Note (Signed)
Mastitis due to patient not breastfeeding likely with bacterial infection  Will ask pt wear sports bra x 2 days w/o removing, avoid showering, place cool packs in bra, do not stimulate the nipples Will Rx Dicloxacillin qid x 10 days with Dilfucan 150 mg x 1 dose to be completed after antibiotic course is complete  Patient to return in 48 hours to clinic or go to the ED if worse

## 2013-12-02 NOTE — Telephone Encounter (Signed)
Pt concerns handled in office visit on 12/01/13.

## 2013-12-12 NOTE — Progress Notes (Signed)
Patient ID: Amy Cordova, female   DOB: 23-Nov-1985, 28 y.o.   MRN: [id number]  Pt seen and examined with Resident.  Assessment:  Mastitis  Plan:  Dicloxacillin for 10 days. If symptoms worsen or fail to improve in 24-36 hours, patient should call office.  Blakeley Margraf H.

## 2013-12-26 ENCOUNTER — Encounter: Payer: Self-pay | Admitting: General Practice

## 2014-01-02 ENCOUNTER — Ambulatory Visit: Payer: Medicaid Other | Admitting: Obstetrics & Gynecology

## 2014-01-14 ENCOUNTER — Ambulatory Visit: Payer: Medicaid Other | Admitting: Obstetrics & Gynecology

## 2014-01-15 ENCOUNTER — Encounter: Payer: Self-pay | Admitting: *Deleted

## 2014-02-03 ENCOUNTER — Encounter: Payer: Self-pay | Admitting: *Deleted

## 2014-02-25 ENCOUNTER — Ambulatory Visit: Payer: Medicaid Other | Admitting: Obstetrics and Gynecology

## 2014-02-25 ENCOUNTER — Telehealth: Payer: Self-pay | Admitting: General Practice

## 2014-02-25 ENCOUNTER — Encounter: Payer: Self-pay | Admitting: General Practice

## 2014-02-25 NOTE — Telephone Encounter (Signed)
Called patient, no answer and unable to leave message due to voicemail box full. Will send letter

## 2014-03-02 ENCOUNTER — Encounter: Payer: Self-pay | Admitting: Obstetrics & Gynecology

## 2014-06-29 ENCOUNTER — Ambulatory Visit: Payer: Medicaid Other | Admitting: Obstetrics & Gynecology

## 2015-06-28 IMAGING — US US OB COMP +14 WK
1 of 2 series · 12 of 28 positions shown · non-contrast
Comparison: none

[Series 1: us ob comp +14 wk · 12 of 86 slices shown]
[im 4/86]
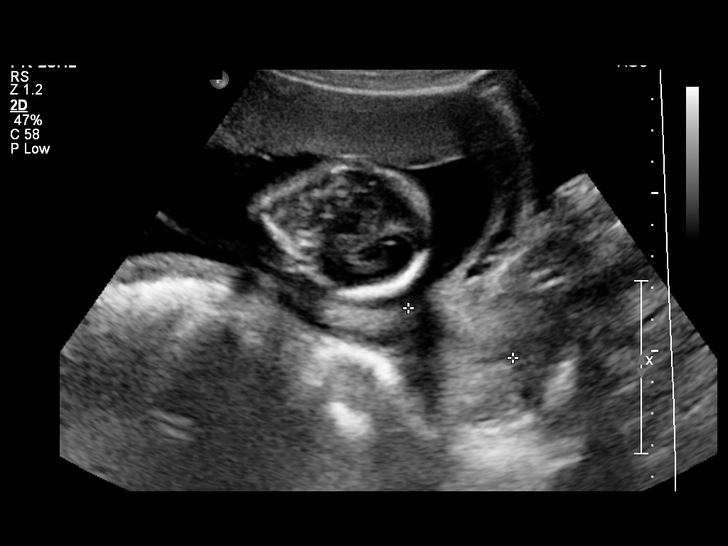
[im 10/86]
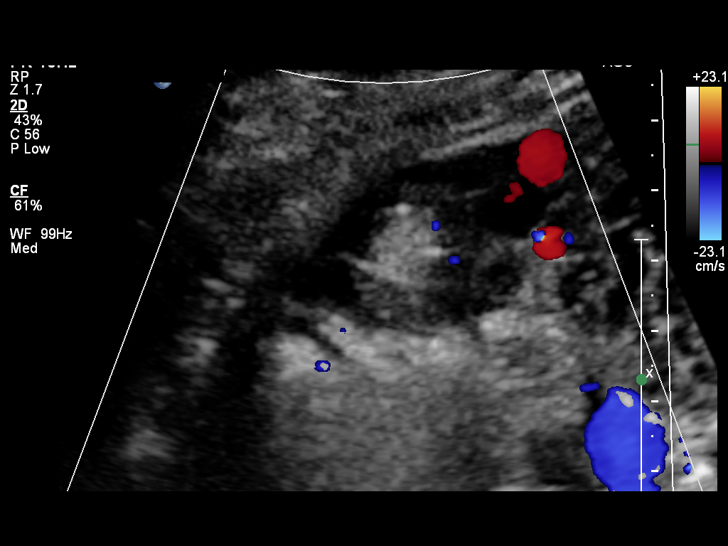
[im 17/86]
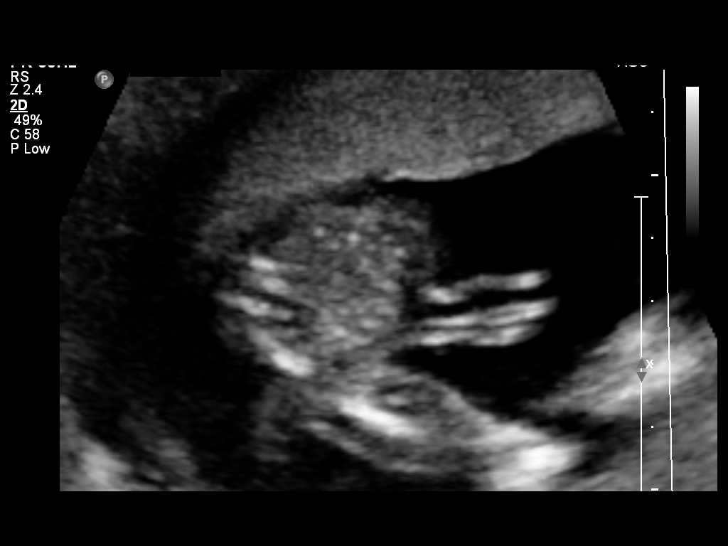
[im 27/86]
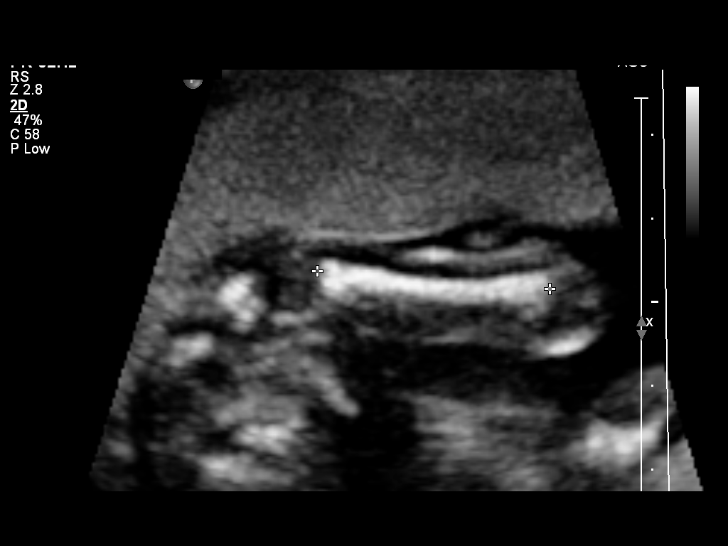
[im 33/86]
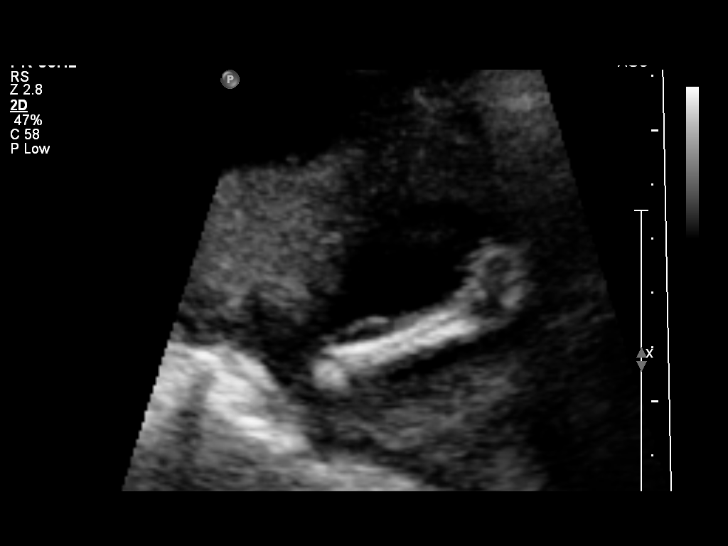
[im 40/86]
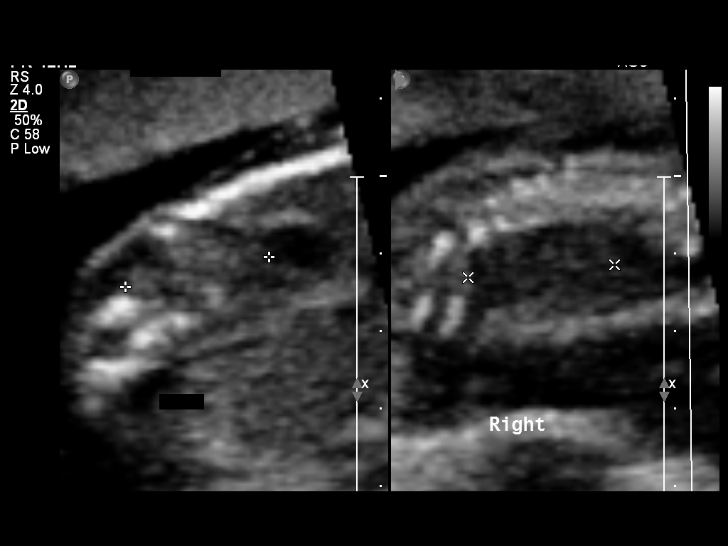
[im 50/86]
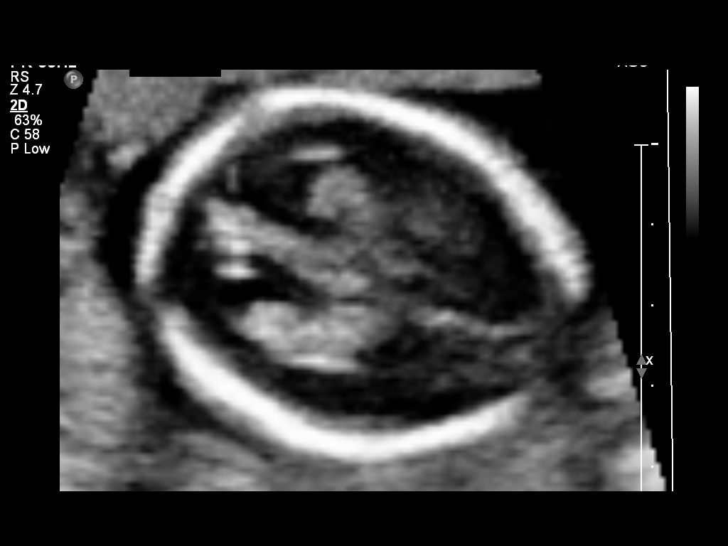
[im 56/86]
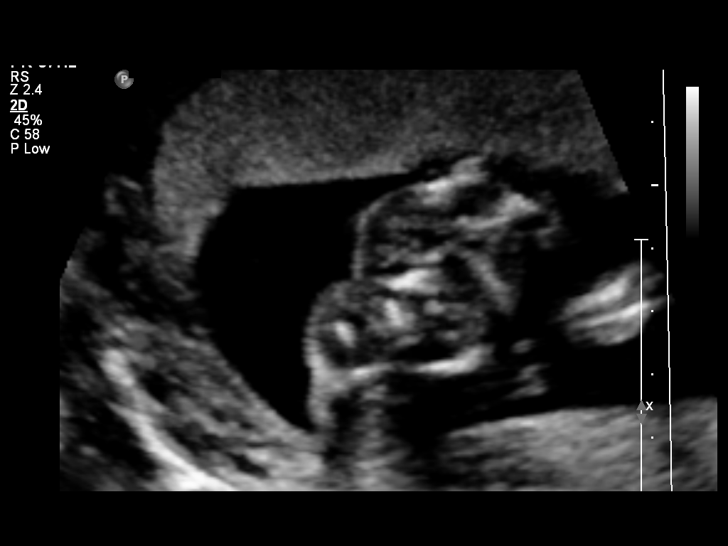
[im 63/86]
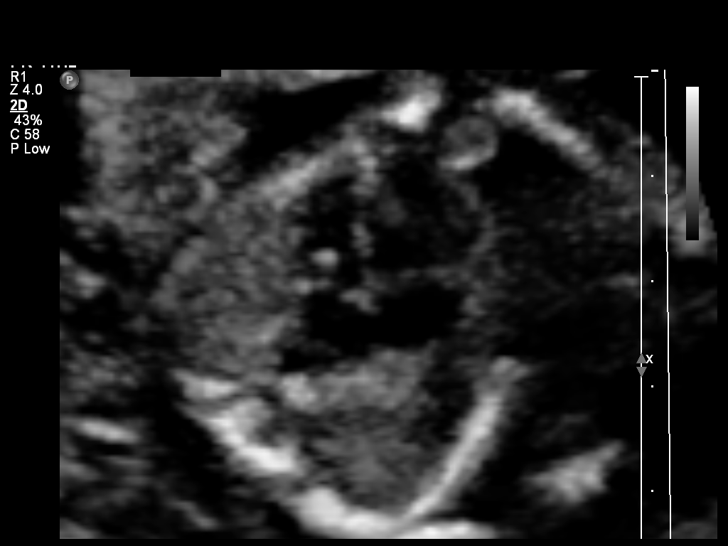
[im 72/86]
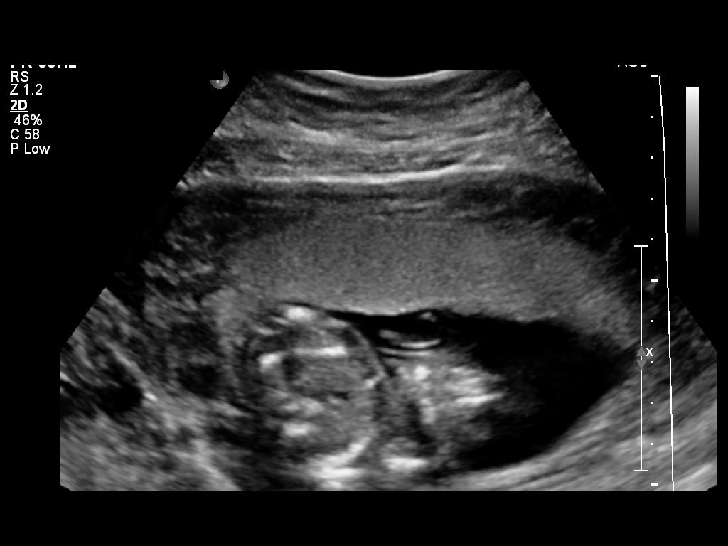
[im 79/86]
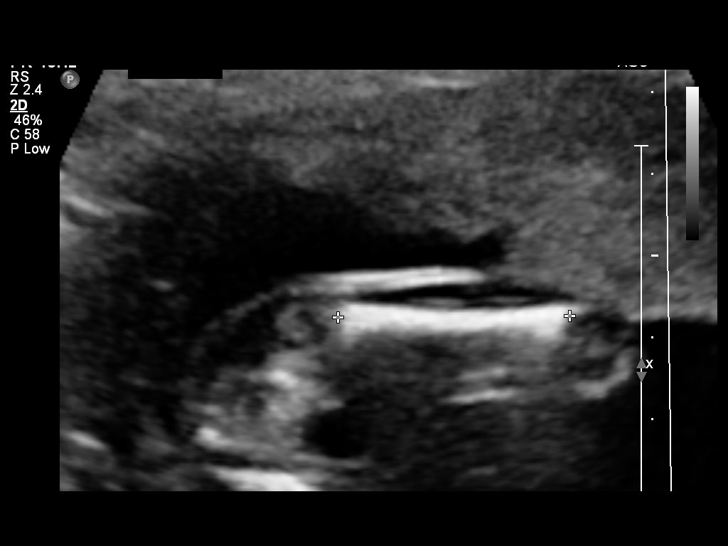
[im 86/86]
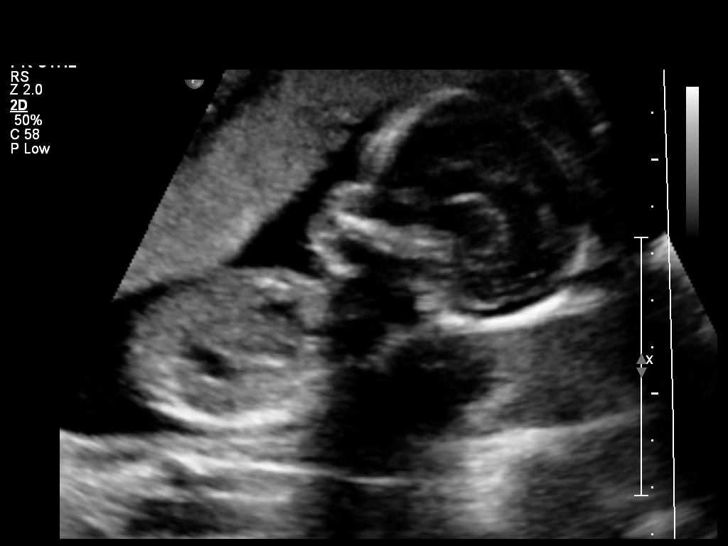

[12 of 28 positions shown; findings below may reference images not displayed]

OBSTETRICS REPORT
                      (Signed Final 07/04/2013 [DATE])

Service(s) Provided

 US OB COMP + 14 WK                                    76805.1
Indications

 Basic anatomic survey
 Hypertension - Gestational
Fetal Evaluation

 Num Of Fetuses:    1
 Fetal Heart Rate:  136                          bpm
 Cardiac Activity:  Observed
 Presentation:      Cephalic
 Placenta:          Anterior, above cervical os
 P. Cord            Visualized, central
 Insertion:

 Amniotic Fluid
 AFI FV:      Subjectively within normal limits
                                             Larg Pckt:     4.6  cm
Biometry

 BPD:     42.7  mm     G. Age:  18w 6d                CI:         71.2   70 - 86
                                                      FL/HC:      17.4   16.1 -

 HC:     161.2  mm     G. Age:  18w 6d       39  %    HC/AC:      1.23   1.09 -

 AC:       131  mm     G. Age:  18w 4d       34  %    FL/BPD:
 FL:        28  mm     G. Age:  18w 4d       29  %    FL/AC:      21.4   20 - 24
 HUM:     27.9  mm     G. Age:  19w 0d       49  %
 CER:     19.2  mm     G. Age:  18w 4d       38  %
 NFT:     1.95  mm

 Est. FW:     250  gm      0 lb 9 oz     38  %
Gestational Age

 LMP:           19w 0d        Date:  02/21/13                 EDD:   11/28/13
 U/S Today:     18w 5d                                        EDD:   11/30/13
 Best:          19w 0d     Det. By:  LMP  (02/21/13)          EDD:   11/28/13
Anatomy
 Cranium:          Appears normal         Aortic Arch:      Not well visualized
 Fetal Cavum:      Appears normal         Ductal Arch:      Appears normal
 Ventricles:       Appears normal         Diaphragm:        Appears normal
 Choroid Plexus:   Appears normal         Stomach:          Appears normal, left
                                                            sided
 Cerebellum:       Appears normal         Abdomen:          Appears normal
 Posterior Fossa:  Appears normal         Abdominal Wall:   Appears nml (cord
                                                            insert, abd wall)
 Nuchal Fold:      Appears normal         Cord Vessels:     Appears normal (3
                                                            vessel cord)
 Face:             Appears normal         Kidneys:          Appear normal
                   (orbits and profile)
 Lips:             Not well visualized    Bladder:          Appears normal
 Heart:            Echogenic focus        Spine:            Appears normal
                   in LV
 RVOT:             Not well visualized    Lower             Appears normal
                                          Extremities:
 LVOT:             Appears normal         Upper             Appears normal
                                          Extremities:

 Other:  Male gender. Heels and 5th digit visualized. Technically difficult due
         to  maternal habitus.
Targeted Anatomy

 Fetal Central Nervous System
 Lat. Ventricles:  5.1                    Cisterna Magna:
Cervix Uterus Adnexa

 Cervical Length:    3.9      cm

 Cervix:       Normal appearance by transabdominal scan.
 Uterus:       No abnormality visualized.
 Cul De Sac:   No free fluid seen.

 Left Ovary:    Size(cm) L: 2.21 x W: 1.83 x H: 1.46  Volume(cc):
 Right Ovary:   Size(cm) L: 3.26 x W: 3.4 x H: 1.96  Volume(cc):
 Adnexa:     No abnormality visualized. No adnexal mass visualized.
Comments

 An echogenic focus was seen in the left cardiac ventricle.
 This is felt to represent a calcified papillary muscle, and is not
 associated with structural or functional cardiac abnormalities.
 Although an echogenic cardiac focus may be associated with
 an increased risk of Down syndrome, this risk is felt to be
 minimal, especially when it is seen as an isolated finding.
Impression

 Single IUP at 18 [DATE] weeks
 An echogenic focus is noted in the left ventricle
 The remainder of the fetal anatomy appears normal
 Somewhat limited views of the face (lips) and heart obtained
 (RVOT, aortic arch)
 No other markers associated with aneuploidy were noted
 Normal amniotic fluid volume

 A 1.6 x 0.7 x 1.0 cm complex ovarian cyst is noted in the right
 adnexa with calcifications - likely dermoid cyst
Recommendations

 Recommend follow-up ultrasound examination in 4 weeks to
 reevaluate the fetal heart and face.

 questions or concerns.

## 2016-03-21 ENCOUNTER — Encounter (HOSPITAL_COMMUNITY): Payer: Self-pay | Admitting: Emergency Medicine

## 2016-03-21 ENCOUNTER — Ambulatory Visit (HOSPITAL_COMMUNITY)
Admission: EM | Admit: 2016-03-21 | Discharge: 2016-03-21 | Disposition: A | Discharge status: Home / Self Care | Payer: Medicaid Other | Attending: Family Medicine | Admitting: Family Medicine

## 2016-03-21 DIAGNOSIS — J069 Acute upper respiratory infection, unspecified: Secondary | ICD-10-CM

## 2016-03-21 MED ORDER — AMOXICILLIN-POT CLAVULANATE 875-125 MG PO TABS: 1.0000 | ORAL_TABLET | Freq: Two times a day (BID) | ORAL | 0 refills | Status: AC

## 2016-03-21 MED ORDER — HYDROCODONE-HOMATROPINE 5-1.5 MG/5ML PO SYRP: 5.0000 mL | ORAL_SOLUTION | Freq: Four times a day (QID) | ORAL | 0 refills | Status: AC | PRN

## 2016-03-21 MED ORDER — FLUCONAZOLE 150 MG PO TABS: 150.0000 mg | ORAL_TABLET | Freq: Once | ORAL | 0 refills | Status: AC

## 2016-03-21 NOTE — ED Triage Notes (Signed)
Pt has been suffering from URI symptoms for one week. Pt reports a fever at home with nasal congestion, cough, hoarse voice and fullness in her sinuses and ears.

## 2016-03-21 NOTE — ED Provider Notes (Signed)
[place]    CSN: [id number] Arrival date & time: 03/21/16  1255     History   Chief Complaint Chief Complaint  Patient presents with  . URI    HPI Amy Cordova is a 30 y.o. female.   This is a 30 year old woman with upper respiratory symptoms for over 7 days. 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  She's been coughing and having sinus congestion as well as hoarseness which is getting worse. She had a fever at first but this is gone. She has no GI symptoms.      Past Medical History:  Diagnosis Date  . Abnormal Pap smear 2000   cervical - laser treatment  . Cervical cancer ([place])   . Headache(784.0)   . Pregnancy induced hypertension   . Trichomonas   . Urinary tract infection     Patient Active Problem List   Diagnosis Date Noted  . Mastitis 12/01/2013  . Encounter for postpartum visit 12/01/2013  . Active labor 11/24/2013  . Indication for care in labor or delivery 11/24/2013  . Group B Streptococcus carrier, +RV culture, currently pregnant 11/03/2013  . Hx of preterm delivery, currently pregnant 06/04/2013  . 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 LASER ABLATION CONDYLOMA CERVICAL / VULVAR    . TUBAL LIGATION Bilateral 11/25/2013   Procedure: POST PARTUM TUBAL LIGATION;  Surgeon: Guss Bunde, MD;  Location: [facility];  Service: Gynecology;  Laterality: Bilateral;    OB History    Gravida Para Term Preterm AB Living   4 3 2 1 1 3    SAB TAB Ectopic Multiple Live Births   1 0 0 0 3       Home Medications    Prior to Admission medications   Medication Sig Start Date End Date Taking? Authorizing Provider  amoxicillin-clavulanate (AUGMENTIN) 875-125 MG tablet Take 1 tablet by mouth every 12 (twelve) hours. 03/21/16   Robyn Haber, MD  fluconazole (DIFLUCAN) 150 MG tablet Take 1 tablet (150 mg total) by mouth once. Repeat if needed 03/21/16 03/21/16  Robyn Haber, MD  HYDROcodone-homatropine [place]) 5-1.5 MG/5ML syrup Take 5 mLs by mouth every 6 (six) hours as needed for cough. 03/21/16   Robyn Haber, MD    Family History Family History  Problem Relation Age of Onset  . Hypertension Mother   . Diabetes Mother   . Diabetes Father   . Hypertension Father   . Other Neg Hx     Social History Social History  Substance Use Topics  . Smoking status: Current Every Day Smoker    Packs/day: 0.25    Years: 0.50    Types: Cigarettes  . Smokeless tobacco: Never Used  . Alcohol use No     Allergies   Other   Review of Systems Review of Systems  Constitutional: Positive for chills and fatigue.  HENT: Positive for congestion, sinus pressure and sore throat.   Respiratory: Positive for cough.   Musculoskeletal: Positive for myalgias.  Skin: Negative.   Neurological: Negative.      Physical Exam Triage Vital Signs ED Triage Vitals [03/21/16 1306]  Enc Vitals Group     BP 152/97     Pulse Rate 93     Resp      Temp 98.4 F (36.9 C)     Temp Source Oral     SpO2 100 %     Weight  Height      Head Circumference      Peak Flow      Pain Score 7     Pain Loc      Pain Edu?      Excl. in [place]?    No data found.   Updated Vital Signs BP 152/97 (BP Location: Left Arm)   Pulse 93   Temp 98.4 F (36.9 C) (Oral)   LMP 03/15/2016 (Exact Date)   SpO2 100%   Physical Exam  Constitutional: She is oriented to person, place, and time. She appears well-developed and well-nourished.  HENT:  Head: Normocephalic.  Right Ear: External ear normal.  Left Ear: External ear normal.  Mouth/Throat: Oropharynx is clear and moist.  Eyes: Conjunctivae and EOM are normal. Pupils are equal, round, and reactive to light.  Neck: Normal range of motion. Neck supple.  Cardiovascular: Normal rate and regular rhythm.   Pulmonary/Chest: Effort normal. She has wheezes.  Musculoskeletal: Normal range of motion.  Neurological: She is  alert and oriented to person, place, and time.  Skin: Skin is warm and dry.  Psychiatric: She has a normal mood and affect.  Nursing note and vitals reviewed.    UC Treatments / Results  Labs (all labs ordered are listed, but only abnormal results are displayed) Labs Reviewed - No data to display  EKG  EKG Interpretation None       Radiology No results found.  Procedures Procedures (including critical care time)  Medications Ordered in UC Medications - No data to display   Initial Impression / Assessment and Plan / UC Course  I have reviewed the triage vital signs and the nursing notes.  Pertinent labs & imaging results that were available during my care of the patient were reviewed by me and considered in my medical decision making (see chart for details).  Clinical Course     Final Clinical Impressions(s) / UC Diagnoses   Final diagnoses:  Acute upper respiratory infection    New Prescriptions New Prescriptions   AMOXICILLIN-CLAVULANATE (AUGMENTIN) 875-125 MG TABLET    Take 1 tablet by mouth every 12 (twelve) hours.   FLUCONAZOLE (DIFLUCAN) 150 MG TABLET    Take 1 tablet (150 mg total) by mouth once. Repeat if needed   HYDROCODONE-HOMATROPINE (HYCODAN) 5-1.5 MG/5ML SYRUP    Take 5 mLs by mouth every 6 (six) hours as needed for cough.     Robyn Haber, MD 03/21/16 1415
